# Patient Record
Sex: Female | Born: 1965 | Race: White | Hispanic: No | Marital: Married | Smoking: Former smoker
Health system: Southern US, Community
[De-identification: ages and names within clinical notes are randomized; demographics above are authoritative.]

## PROBLEM LIST (undated history)

## (undated) DIAGNOSIS — T7840XA Allergy, unspecified, initial encounter: Secondary | ICD-10-CM

## (undated) DIAGNOSIS — M1990 Unspecified osteoarthritis, unspecified site: Secondary | ICD-10-CM

## (undated) HISTORY — PX: WISDOM TOOTH EXTRACTION: SHX21

## (undated) HISTORY — DX: Allergy, unspecified, initial encounter: T78.40XA

## (undated) HISTORY — PX: OTHER SURGICAL HISTORY: SHX169

---

## 2011-02-27 ENCOUNTER — Inpatient Hospital Stay (HOSPITAL_COMMUNITY): Admission: RE | Admit: 2011-02-27 | Payer: Worker's Compensation | Source: Ambulatory Visit

## 2012-09-02 ENCOUNTER — Ambulatory Visit (INDEPENDENT_AMBULATORY_CARE_PROVIDER_SITE_OTHER): Payer: 59 | Admitting: Family Medicine

## 2012-09-02 VITALS — BP 112/70 | HR 73 | Temp 98.1°F | Resp 16 | Ht 65.0 in | Wt 122.0 lb

## 2012-09-02 DIAGNOSIS — L237 Allergic contact dermatitis due to plants, except food: Secondary | ICD-10-CM

## 2012-09-02 DIAGNOSIS — L255 Unspecified contact dermatitis due to plants, except food: Secondary | ICD-10-CM

## 2012-09-02 MED ORDER — PREDNISONE 20 MG PO TABS: ORAL_TABLET | ORAL | Status: DC

## 2012-09-02 NOTE — Progress Notes (Signed)
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 Poison ivy  predniSONE (DELTASONE) 20 MG tablet

## 2012-09-02 NOTE — Patient Instructions (Signed)
Poison Ivy  Poison ivy is a inflammation of the skin (contact dermatitis) caused by touching the allergens on the leaves of the ivy plant following previous exposure to the plant. The rash usually appears 48 hours after exposure. The rash is usually bumps (papules) or blisters (vesicles) in a linear pattern. Depending on your own sensitivity, the rash may simply cause redness and itching, or it may also progress to blisters which may break open. These must be well cared for to prevent secondary bacterial (germ) infection, followed by scarring. Keep any open areas dry, clean, dressed, and covered with an antibacterial ointment if needed. The eyes may also get puffy. The puffiness is worst in the morning and gets better as the day progresses. This dermatitis usually heals without scarring, within 2 to 3 weeks without treatment.  HOME CARE INSTRUCTIONS   Thoroughly wash with soap and water as soon as you have been exposed to poison ivy. You have about one half hour to remove the plant resin before it will cause the rash. This washing will destroy the oil or antigen on the skin that is causing, or will cause, the rash. Be sure to wash under your fingernails as any plant resin there will continue to spread the rash. Do not rub skin vigorously when washing affected area. Poison ivy cannot spread if no oil from the plant remains on your body. A rash that has progressed to weeping sores will not spread the rash unless you have not washed thoroughly. It is also important to wash any clothes you have been wearing as these may carry active allergens. The rash will return if you wear the unwashed clothing, even several days later.  Avoidance of the plant in the future is the best measure. Poison ivy plant can be recognized by the number of leaves. Generally, poison ivy has three leaves with flowering branches on a single stem.  Diphenhydramine may be purchased over the counter and used as needed for itching. Do not drive with  this medication if it makes you drowsy.Ask your caregiver about medication for children.  SEEK MEDICAL CARE IF:   Open sores develop.   Redness spreads beyond area of rash.   You notice purulent (pus-like) discharge.   You have increased pain.   Other signs of infection develop (such as fever).  Document Released: 11/02/2000 Document Revised: 01/28/2012 Document Reviewed: 09/21/2009  [employer] Patient Information 2013 [employer].

## 2013-03-03 ENCOUNTER — Ambulatory Visit (INDEPENDENT_AMBULATORY_CARE_PROVIDER_SITE_OTHER): Payer: 59 | Admitting: Physician Assistant

## 2013-03-03 VITALS — BP 100/60 | HR 86 | Temp 98.4°F | Resp 16 | Ht 66.0 in | Wt 124.0 lb

## 2013-03-03 DIAGNOSIS — R05 Cough: Secondary | ICD-10-CM

## 2013-03-03 DIAGNOSIS — J40 Bronchitis, not specified as acute or chronic: Secondary | ICD-10-CM

## 2013-03-03 MED ORDER — AZITHROMYCIN 250 MG PO TABS: ORAL_TABLET | ORAL | Status: DC

## 2013-03-03 MED ORDER — HYDROCODONE-HOMATROPINE 5-1.5 MG/5ML PO SYRP: ORAL_SOLUTION | ORAL | Status: DC

## 2013-03-03 NOTE — Progress Notes (Signed)
Patient ID: Danielle Davidson MRN: [id number], DOB: 1966/04/29, 47 y.o. Date of Encounter: 03/03/2013, 11:52 AM  Primary Physician: No primary provider on file.  Chief Complaint:  Chief Complaint  Patient presents with  . chest congestion  . Cough    HPI: 47 y.o. female presents with a two day history of nasal congestion, post nasal drip, sore throat, and cough. Mild sinus pressure. Afebrile. No chills. Nasal congestion thick and green/yellow. Cough is productive of green/yellow sputum and not associated with time of day. No shortness of breath or wheezing. Ears feel full, leading to sensation of muffled hearing. Has tried OTC cold preps without success. No GI complaints. Appetite normal. Patient states she usually develops nasal congestion first then that progresses to chest congestion and cough, however this time she immediately developed chest congestion and cough prompting her to come in for evaluation. Her partner has been sick with similar symptoms for just over a week. Some tobacco use. No recent antibiotics, or recent travels. No leg trauma, sedentary periods, or h/o cancer.  Past Medical History  Diagnosis Date  . Allergy      Home Meds: Prior to Admission medications   Medication Sig Start Date End Date Taking? Authorizing Provider  fexofenadine (ALLEGRA) 60 MG tablet Take 60 mg by mouth daily.   Yes Historical Provider, MD                  Allergies:  Allergies  Allergen Reactions  . Codeine Nausea Only    History   Social History  . Marital Status: Single    Spouse Name: N/A    Number of Children: N/A  . Years of Education: N/A   Occupational History  . Not on file.   Social History Main Topics  . Smoking status: Current Every Day Smoker  . Smokeless tobacco: Not on file  . Alcohol Use: Yes  . Drug Use: No  . Sexually Active: Not on file   Other Topics Concern  . Not on file   Social History Narrative  . No narrative on file     Review of  Systems: Constitutional: negative for chills, fever, night sweats or weight changes HEENT: see above Cardiovascular: negative for chest pain or palpitations Respiratory: positive for cough. negative for hemoptysis, dyspnea, wheezing, or shortness of breath Abdominal: negative for abdominal pain, nausea, vomiting or diarrhea Dermatological: negative for rash Neurologic: negative for headache   Physical Exam: Blood pressure 100/60, pulse 86, temperature 98.4 F (36.9 C), temperature source Oral, resp. rate 16, height 5\' 6"  (1.676 m), weight 124 lb (56.246 kg), SpO2 100.00%., Body mass index is 20.02 kg/(m^2). General: Well developed, well nourished, in no acute distress. Head: Normocephalic, atraumatic, eyes without discharge, sclera non-icteric, nares are congested. Bilateral auditory canals clear, TM's are without perforation, pearly grey with reflective cone of light bilaterally. No sinus TTP. Oral cavity moist, dentition normal. Posterior pharynx with post nasal drip and mild erythema. No peritonsillar abscess or tonsillar exudate. Neck: Supple. No thyromegaly. Full ROM. No lymphadenopathy. Lungs: Coarse breath sounds bilaterally without wheezes, rales, or rhonchi. Breathing is unlabored.  Heart: RRR with S1 S2. No murmurs, rubs, or gallops appreciated. Msk:  Strength and tone normal for age. Extremities: No clubbing or cyanosis. No edema. Neuro: Alert and oriented X 3. Moves all extremities spontaneously. CNII-XII grossly in tact. Psych:  Responds to questions appropriately with a normal affect.     ASSESSMENT AND PLAN:  47 y.o. female with bronchitis. -Azithromycin 250  MG #6 2 po first day then 1 po next 4 days no RF -Hycodan #4oz 1 tsp po q 4-6 hours prn cough no RF SED -Mucinex -Tylenol/Motrin prn -Rest/fluids -RTC precautions -RTC 3-5 days if no improvement  Signed, Eula Listen, PA-C 03/03/2013 11:52 AM

## 2013-08-31 ENCOUNTER — Ambulatory Visit: Payer: 59

## 2013-08-31 ENCOUNTER — Ambulatory Visit (INDEPENDENT_AMBULATORY_CARE_PROVIDER_SITE_OTHER): Payer: 59 | Admitting: Family Medicine

## 2013-08-31 VITALS — BP 100/60 | HR 88 | Temp 98.7°F | Resp 16 | Ht 65.5 in | Wt 122.2 lb

## 2013-08-31 DIAGNOSIS — F17200 Nicotine dependence, unspecified, uncomplicated: Secondary | ICD-10-CM

## 2013-08-31 DIAGNOSIS — R05 Cough: Secondary | ICD-10-CM

## 2013-08-31 DIAGNOSIS — J40 Bronchitis, not specified as acute or chronic: Secondary | ICD-10-CM

## 2013-08-31 MED ORDER — AZITHROMYCIN 250 MG PO TABS: ORAL_TABLET | ORAL | Status: DC

## 2013-08-31 MED ORDER — HYDROCODONE-HOMATROPINE 5-1.5 MG/5ML PO SYRP: ORAL_SOLUTION | ORAL | Status: DC

## 2013-08-31 NOTE — Patient Instructions (Signed)

## 2013-08-31 NOTE — Progress Notes (Signed)
Subjective:  This chart was scribed for Norberto Sorenson, MD by Quintella Reichert, ED scribe.  This patient was seen in room [facility] Room 5 and the patient's care was started at 9:58 AM.   Patient ID: Danielle Davidson, female    DOB: 06-25-1966, 47 y.o.   MRN: [id number]   Chief Complaint  Patient presents with  . Cold symptoms    cough, yellow mucus,fatigue, sinus pressure    HPI  HPI Comments: Danielle Davidson is a 47 y.o. female who presents with a chief complaint of 3 days of cold symptoms.  Pt states she initially developed a sore throat 3 days ago.  2 days ago she developed sinus congestion and nasal which have been present each day since then but are temporarily relieved by neti pot, saline spray, Allegra, and a hot shower.  Last night she was sitting down watching a movie when she developed chest congestion.  Since then she has been coughing up yellow sputum without blood.  She had a headache yesterday. She denies ear pain, gum pain, facial pain, fevers, chills, or night sweats.  She had trouble sleeping last night due to chest congestion.  She is producing urine regularly and moving her bowels regularly.    Pt states she used to get sinus congestions 3x/year but since she started using a neti pot she has only had 2 over the past 5 years.  She is a half-pack-per-day smoker.   Past Medical History  Diagnosis Date  . Allergy       Medication List       This list is accurate as of: 08/31/13 10:00 AM.  Always use your most recent med list.               azithromycin 250 MG tablet  Commonly known as:  ZITHROMAX Z-PAK  2 tabs po first day, then 1 tab po next 4 days     fexofenadine 60 MG tablet  Commonly known as:  ALLEGRA  Take 60 mg by mouth daily.     HYDROcodone-homatropine 5-1.5 MG/5ML syrup  Commonly known as:  HYCODAN  1 TSP PO Q 4-6 HOURS PRN COUGH         Allergies  Allergen Reactions  . Codeine Nausea Only       Review of Systems  Constitutional: Positive  for fatigue. Negative for fever, chills, diaphoresis, activity change, appetite change and unexpected weight change.  HENT: Positive for congestion, postnasal drip, rhinorrhea, sinus pressure and sore throat. Negative for dental problem, ear pain, facial swelling, mouth sores and nosebleeds.   Respiratory: Positive for cough, shortness of breath and wheezing.   Gastrointestinal: Negative for nausea, vomiting, abdominal pain, diarrhea and constipation.  Genitourinary: Negative for urgency, hematuria and decreased urine volume.  Skin: Negative for rash.  Neurological: Positive for headaches. Negative for dizziness, weakness and light-headedness.  Hematological: Negative for adenopathy. Does not bruise/bleed easily.  Psychiatric/Behavioral: Positive for sleep disturbance.       BP 100/60  Pulse 88  Temp(Src) 98.7 F (37.1 C) (Oral)  Resp 16  Ht 5' 5.5" (1.664 m)  Wt 122 lb 3.2 oz (55.43 kg)  BMI 20.02 kg/m2  SpO2 99% Objective:   Physical Exam  Nursing note and vitals reviewed. Constitutional: She is oriented to person, place, and time. She appears well-developed and well-nourished. No distress.  HENT:  Head: Normocephalic and atraumatic.  Right Ear: Tympanic membrane is not injected. A middle ear effusion is present.  Left Ear: Tympanic membrane is  injected. A middle ear effusion is present.  Mouth/Throat: Posterior oropharyngeal erythema present. No oropharyngeal exudate or posterior oropharyngeal edema.  Left nare swollen with redness  Eyes: EOM are normal.  Neck: Neck supple. No tracheal deviation present.  Cardiovascular: Normal rate, regular rhythm, S1 normal and S2 normal.   Pulmonary/Chest: Effort normal. No respiratory distress. She has no decreased breath sounds. She has wheezes. She has no rhonchi. She has no rales.  Left lower lobe has expiratory wheezing.  Lungs otherwise clear.  Musculoskeletal: Normal range of motion.  Lymphadenopathy:       Head (right side):  Submandibular and tonsillar adenopathy present. No preauricular and no posterior auricular adenopathy present.       Head (left side): No submandibular, no tonsillar, no preauricular and no posterior auricular adenopathy present.    She has cervical adenopathy.       Right cervical: No posterior cervical adenopathy present.      Left cervical: No posterior cervical adenopathy present.  Anterior cervical chain swollen on the left and the right  Neurological: She is alert and oriented to person, place, and time.  Skin: Skin is warm and dry.  Psychiatric: She has a normal mood and affect. Her behavior is normal.   Peak flow 400, goal peak flow 467 Primary chest x-ray read by Dr. Clelia Croft: No acute abnormality.  ?osteoprosis.   XRAY OVERREAD NOTES COPD Assessment & Plan:  Tobacco use disorder - Plan: DG Chest 2 View  Cough - Plan: DG Chest 2 View, HYDROcodone-homatropine (HYCODAN) 5-1.5 MG/5ML syrup, azithromycin (ZITHROMAX Z-PAK) 250 MG tablet  Bronchitis, not specified as acute or chronic - Plan: azithromycin (ZITHROMAX Z-PAK) 250 MG tablet  Meds ordered this encounter  Medications  . HYDROcodone-homatropine (HYCODAN) 5-1.5 MG/5ML syrup    Sig: 1 TSP PO Q6 HOURS PRN COUGH    Dispense:  120 mL    Refill:  0    Order Specific Question:  Supervising Provider    Answer:  Ethelda Chick [[id number]]  . azithromycin (ZITHROMAX Z-PAK) 250 MG tablet    Sig: 2 tabs po first day, then 1 tab po next 4 days    Dispense:  6 tablet    Refill:  0    Order Specific Question:  Supervising Provider    Answer:  Ethelda Chick [[id number]]

## 2013-09-18 ENCOUNTER — Ambulatory Visit (INDEPENDENT_AMBULATORY_CARE_PROVIDER_SITE_OTHER): Payer: 59 | Admitting: Family Medicine

## 2013-09-18 VITALS — BP 90/56 | HR 83 | Temp 98.1°F | Resp 16 | Ht 64.0 in | Wt 125.8 lb

## 2013-09-18 DIAGNOSIS — F17200 Nicotine dependence, unspecified, uncomplicated: Secondary | ICD-10-CM

## 2013-09-18 DIAGNOSIS — J209 Acute bronchitis, unspecified: Secondary | ICD-10-CM

## 2013-09-18 LAB — PULMONARY FUNCTION TEST

## 2013-10-18 NOTE — Progress Notes (Signed)
   Subjective:    Patient ID: Danielle Davidson, female    DOB: [birth year], 47 y.o.   MRN: [id number] Chief Complaint  Patient presents with  . Follow-up    cough   HPI  Ms. Luckow is here for f/u 3 wks after I treated her for a COPD exacerbation. She is a long-time smoker w/ no desire to quit. She does have an occ chronic cough but doesn't bother her, not much sputum, no SHoB or DOE, no wheezing.  She is very physically active - gets lots of exercise, takes the stairs at work. She has no h/o any pulmonary diagnosis but when I saw her for her acute illness sev wks ago I was concerned that her CXR and reduced peak flow were indicative of underlying undiagnosed COPD so she is here to recheck on that. She does not think she has any lung problems or sequelae from smoking.  Past Medical History  Diagnosis Date  . Allergy    Current Outpatient Prescriptions on File Prior to Visit  Medication Sig Dispense Refill  . fexofenadine (ALLEGRA) 60 MG tablet Take 60 mg by mouth daily.       No current facility-administered medications on file prior to visit.   Allergies  Allergen Reactions  . Codeine Nausea Only    Review of Systems  Constitutional: Negative for fever, chills, diaphoresis, activity change and fatigue.  HENT: Negative for congestion, rhinorrhea and sore throat.   Respiratory: Positive for cough. Negative for chest tightness, shortness of breath and wheezing.   Cardiovascular: Negative for chest pain, palpitations and leg swelling.  Musculoskeletal: Negative for arthralgias and back pain.  Hematological: Negative for adenopathy.  Psychiatric/Behavioral: Negative for sleep disturbance.      BP 90/56  Pulse 83  Temp(Src) 98.1 F (36.7 C) (Oral)  Resp 16  Ht 5\' 4"  (1.626 m)  Wt 125 lb 12.8 oz (57.063 kg)  BMI 21.58 kg/m2  SpO2 100%  Objective:   Physical Exam  Constitutional: She is oriented to person, place, and time. She appears well-developed and well-nourished. No  distress.  HENT:  Head: Normocephalic and atraumatic.  Right Ear: Tympanic membrane, external ear and ear canal normal.  Left Ear: Tympanic membrane, external ear and ear canal normal.  Nose: Nose normal. No mucosal edema or rhinorrhea.  Mouth/Throat: Uvula is midline, oropharynx is clear and moist and mucous membranes are normal. No oropharyngeal exudate.  Eyes: Conjunctivae are normal. Right eye exhibits no discharge. Left eye exhibits no discharge. No scleral icterus.  Neck: Normal range of motion. Neck supple.  Cardiovascular: Normal rate, regular rhythm, normal heart sounds and intact distal pulses.   Pulmonary/Chest: Effort normal and breath sounds normal.  Lymphadenopathy:    She has no cervical adenopathy.  Neurological: She is alert and oriented to person, place, and time.  Skin: Skin is warm and dry. She is not diaphoretic. No erythema.  Psychiatric: She has a normal mood and affect. Her behavior is normal.          Assessment & Plan:  Tobacco use disorder - Plan: Pulmonary function test, Pulmonary function test PFTs were entered into flow sheet and scanned into media tab.  Pt's PFTs actually look pretty good and as she is asymptomatic at baseline she does NOT appear to have developed COPD yet. She is pre-contemplative as far as smoking cessation and is glad her lung tests are good.  F/u prn   Norberto Sorenson, MD MPH

## 2014-03-18 ENCOUNTER — Ambulatory Visit (INDEPENDENT_AMBULATORY_CARE_PROVIDER_SITE_OTHER): Payer: 59 | Admitting: Physician Assistant

## 2014-03-18 VITALS — BP 100/60 | HR 80 | Temp 98.2°F | Resp 16 | Ht 65.0 in | Wt 136.0 lb

## 2014-03-18 DIAGNOSIS — J988 Other specified respiratory disorders: Secondary | ICD-10-CM

## 2014-03-18 DIAGNOSIS — R05 Cough: Secondary | ICD-10-CM

## 2014-03-18 DIAGNOSIS — J22 Unspecified acute lower respiratory infection: Secondary | ICD-10-CM

## 2014-03-18 DIAGNOSIS — R059 Cough, unspecified: Secondary | ICD-10-CM

## 2014-03-18 MED ORDER — HYDROCODONE-HOMATROPINE 5-1.5 MG/5ML PO SYRP: 5.0000 mL | ORAL_SOLUTION | Freq: Three times a day (TID) | ORAL | Status: DC | PRN

## 2014-03-18 MED ORDER — AZITHROMYCIN 250 MG PO TABS: ORAL_TABLET | ORAL | Status: DC

## 2014-03-18 NOTE — Progress Notes (Signed)
   Subjective:    Patient ID: Danielle Davidson, female    DOB: 09-Dec-1965, 48 y.o.   MRN: [id number]  HPI 48 year old female presents for evaluation of acute onset of chest congestion, slight cough, chills, body aches and subjective fever. States symptoms started yesterday and have gotten worse today. Had a co-worker who has been sick this week and she wants to "nip this in the bud."  Hx of bronchitis and possibly early COPD. She does continue to smoke, about 1/2 ppd.  Has been treated successfully in the past with a Zpack and Hycodan cough syrup. Reports she tolerates hycodan without any nausea or GI upset.     Review of Systems  Constitutional: Positive for fever (subjective), chills and fatigue.  HENT: Negative for congestion and sore throat.   Respiratory: Positive for cough. Negative for chest tightness and shortness of breath.   Gastrointestinal: Negative for nausea, vomiting and abdominal pain.  Neurological: Negative for dizziness.       Objective:   Physical Exam  Constitutional: She is oriented to person, place, and time. She appears well-developed and well-nourished.  HENT:  Head: Normocephalic and atraumatic.  Right Ear: Hearing, tympanic membrane, external ear and ear canal normal.  Left Ear: Hearing, tympanic membrane, external ear and ear canal normal.  Mouth/Throat: Uvula is midline, oropharynx is clear and moist and mucous membranes are normal.  Eyes: Conjunctivae are normal.  Neck: Normal range of motion.  Cardiovascular: Normal rate, regular rhythm and normal heart sounds.   Pulmonary/Chest: Effort normal and breath sounds normal.  Neurological: She is alert and oriented to person, place, and time.  Psychiatric: She has a normal mood and affect. Her behavior is normal. Judgment and thought content normal.          Assessment & Plan:  Cough - Plan: azithromycin (ZITHROMAX) 250 MG tablet, HYDROcodone-homatropine (HYCODAN) 5-1.5 MG/5ML syrup  Lower respiratory  infection (e.g., bronchitis, pneumonia, pneumonitis, pulmonitis) - Plan: azithromycin (ZITHROMAX) 250 MG tablet, HYDROcodone-homatropine (HYCODAN) 5-1.5 MG/5ML syrup  Likely viral URI. Recommend conservative treatment x 48 hours with ibuprofen as directed and hycodan qhs prn cough.  If no improvement or symptoms worsen, ok to start [place]. RTC precautions discussed Follow up if symptoms worsen or fail to improve.

## 2014-03-22 ENCOUNTER — Ambulatory Visit: Payer: 59

## 2014-03-22 ENCOUNTER — Ambulatory Visit (INDEPENDENT_AMBULATORY_CARE_PROVIDER_SITE_OTHER): Payer: 59 | Admitting: Internal Medicine

## 2014-03-22 VITALS — BP 104/68 | HR 71 | Temp 97.8°F | Resp 16 | Ht 65.0 in | Wt 135.6 lb

## 2014-03-22 DIAGNOSIS — F17200 Nicotine dependence, unspecified, uncomplicated: Secondary | ICD-10-CM

## 2014-03-22 DIAGNOSIS — J329 Chronic sinusitis, unspecified: Secondary | ICD-10-CM

## 2014-03-22 DIAGNOSIS — R51 Headache: Secondary | ICD-10-CM

## 2014-03-22 DIAGNOSIS — R61 Generalized hyperhidrosis: Secondary | ICD-10-CM

## 2014-03-22 LAB — POCT CBC
Granulocyte percent: 58.6 %G (ref 37–80)
HEMATOCRIT: 49.2 % — AB (ref 37.7–47.9)
HEMOGLOBIN: 16.1 g/dL (ref 12.2–16.2)
Lymph, poc: 1.4 (ref 0.6–3.4)
MCH: 30.3 pg (ref 27–31.2)
MCHC: 32.7 g/dL (ref 31.8–35.4)
MCV: 92.6 fL (ref 80–97)
MID (cbc): 0.3 (ref 0–0.9)
MPV: 9 fL (ref 0–99.8)
POC Granulocyte: 2.5 (ref 2–6.9)
POC LYMPH PERCENT: 33.9 %L (ref 10–50)
POC MID %: 7.5 % (ref 0–12)
Platelet Count, POC: 236 10*3/uL (ref 142–424)
RBC: 5.31 M/uL (ref 4.04–5.48)
RDW, POC: 14.4 %
WBC: 4.2 10*3/uL — AB (ref 4.6–10.2)

## 2014-03-22 MED ORDER — LEVOFLOXACIN 500 MG PO TABS: 500.0000 mg | ORAL_TABLET | Freq: Every day | ORAL | Status: DC

## 2014-03-22 MED ORDER — FLUTICASONE PROPIONATE 50 MCG/ACT NA SUSP: 2.0000 | Freq: Every day | NASAL | Status: DC

## 2014-03-22 MED ORDER — PREDNISONE 10 MG PO TABS: ORAL_TABLET | ORAL | Status: DC

## 2014-03-22 NOTE — Progress Notes (Signed)
   Subjective:    Patient ID: Danielle Davidson, female    DOB: 07-Jul-1966, 48 y.o.   MRN: [id number]  HPI    Review of Systems     Objective:   Physical Exam        Assessment & Plan:

## 2014-03-22 NOTE — Progress Notes (Signed)
   Subjective:    Patient ID: Danielle Davidson, female    DOB: 1966/10/05, 48 y.o.   MRN: [id number]  HPI Patient presents today with chest congestion and headache and low grade fever. The fever started yesterday, chest congestion started Friday and she was seen on Friday by Heather. She was prescribed a Z-Pack. The antibiotic has not been helping. She complains of headache and fatigue and sever sinusitis. All of her symptoms have not gotten better. She is feeling worse from Fridays visit. She states she has not had much cough, mainly the sinus pressure is the biggest problem. She has a history of seasonal allergies, although she does not think she is having allergies. She has not seen any ticks on her body. She does not have any chest pain, no SOB, no wheezing. When she blows her nose the mucus is clear. She has tried Afrin otc to help open her sinuses. She states her symptoms started as chest congestion and in the last couple days the symptoms have turned to chills, sweats, sinusitis.   Review of Systems     Objective:   Physical Exam  Vitals reviewed. Constitutional: She is oriented to person, place, and time. She appears well-developed and well-nourished. No distress.  HENT:  Head: Normocephalic.  Nose: Mucosal edema, rhinorrhea and sinus tenderness present. No epistaxis. Right sinus exhibits maxillary sinus tenderness and frontal sinus tenderness. Left sinus exhibits maxillary sinus tenderness and frontal sinus tenderness.  Eyes: Conjunctivae and EOM are normal. Pupils are equal, round, and reactive to light.  Neck: Normal range of motion. Neck supple.  Cardiovascular: Normal rate.   Pulmonary/Chest: Effort normal and breath sounds normal.  Neurological: She is alert and oriented to person, place, and time. No cranial nerve deficit. She exhibits normal muscle tone. Coordination normal.  Psychiatric: She has a normal mood and affect.   Results for orders placed in visit on 03/22/14  POCT  CBC      Result Value Ref Range   WBC 4.2 (*) 4.6 - 10.2 K/uL   Lymph, poc 1.4  0.6 - 3.4   POC LYMPH PERCENT 33.9  10 - 50 %L   MID (cbc) 0.3  0 - 0.9   POC MID % 7.5  0 - 12 %M   POC Granulocyte 2.5  2 - 6.9   Granulocyte percent 58.6  37 - 80 %G   RBC 5.31  4.04 - 5.48 M/uL   Hemoglobin 16.1  12.2 - 16.2 g/dL   HCT, POC 49.2 (*) 37.7 - 47.9 %   MCV 92.6  80 - 97 fL   MCH, POC 30.3  27 - 31.2 pg   MCHC 32.7  31.8 - 35.4 g/dL   RDW, POC 14.4     Platelet Count, POC 236  142 - 424 K/uL   MPV 9.0  0 - 99.8 fL      [facility] reading (PRIMARY) by  Dr Elder Cyphers. Swollen turbinates, no air fluid levels seen      Assessment & Plan:  Sinusitis/Nasal obstruction Prednisone 6d taper/Fluticasone nasal Levaquin

## 2014-03-22 NOTE — Patient Instructions (Addendum)
Sinusitis Sinusitis is redness, soreness, and swelling (inflammation) of the paranasal sinuses. Paranasal sinuses are air pockets within the bones of your face (beneath the eyes, the middle of the forehead, or above the eyes). In healthy paranasal sinuses, mucus is able to drain out, and air is able to circulate through them by way of your nose. However, when your paranasal sinuses are inflamed, mucus and air can become trapped. This can allow bacteria and other germs to grow and cause infection. Sinusitis can develop quickly and last only a short time (acute) or continue over a long period (chronic). Sinusitis that lasts for more than 12 weeks is considered chronic.  CAUSES  Causes of sinusitis include:  Allergies.  Structural abnormalities, such as displacement of the cartilage that separates your nostrils (deviated septum), which can decrease the air flow through your nose and sinuses and affect sinus drainage.  Functional abnormalities, such as when the small hairs (cilia) that line your sinuses and help remove mucus do not work properly or are not present. SYMPTOMS  Symptoms of acute and chronic sinusitis are the same. The primary symptoms are pain and pressure around the affected sinuses. Other symptoms include:  Upper toothache.  Earache.  Headache.  Bad breath.  Decreased sense of smell and taste.  A cough, which worsens when you are lying flat.  Fatigue.  Fever.  Thick drainage from your nose, which often is green and may contain pus (purulent).  Swelling and warmth over the affected sinuses. DIAGNOSIS  Your caregiver will perform a physical exam. During the exam, your caregiver may:  Look in your nose for signs of abnormal growths in your nostrils (nasal polyps).  Tap over the affected sinus to check for signs of infection.  View the inside of your sinuses (endoscopy) with a special imaging device with a light attached (endoscope), which is inserted into your  sinuses. If your caregiver suspects that you have chronic sinusitis, one or more of the following tests may be recommended:  Allergy tests.  Nasal culture A sample of mucus is taken from your nose and sent to a lab and screened for bacteria.  Nasal cytology A sample of mucus is taken from your nose and examined by your caregiver to determine if your sinusitis is related to an allergy. TREATMENT  Most cases of acute sinusitis are related to a viral infection and will resolve on their own within 10 days. Sometimes medicines are prescribed to help relieve symptoms (pain medicine, decongestants, nasal steroid sprays, or saline sprays).  However, for sinusitis related to a bacterial infection, your caregiver will prescribe antibiotic medicines. These are medicines that will help kill the bacteria causing the infection.  Rarely, sinusitis is caused by a fungal infection. In theses cases, your caregiver will prescribe antifungal medicine. For some cases of chronic sinusitis, surgery is needed. Generally, these are cases in which sinusitis recurs more than 3 times per year, despite other treatments. HOME CARE INSTRUCTIONS   Drink plenty of water. Water helps thin the mucus so your sinuses can drain more easily.  Use a humidifier.  Inhale steam 3 to 4 times a day (for example, sit in the bathroom with the shower running).  Apply a warm, moist washcloth to your face 3 to 4 times a day, or as directed by your caregiver.  Use saline nasal sprays to help moisten and clean your sinuses.  Take over-the-counter or prescription medicines for pain, discomfort, or fever only as directed by your caregiver. SEEK IMMEDIATE MEDICAL   CARE IF:  You have increasing pain or severe headaches.  You have nausea, vomiting, or drowsiness.  You have swelling around your face.  You have vision problems.  You have a stiff neck.  You have difficulty breathing. MAKE SURE YOU:   Understand these  instructions.  Will watch your condition.  Will get help right away if you are not doing well or get worse. Document Released: 11/05/2005 Document Revised: 01/28/2012 Document Reviewed: 11/20/2011 [facility] Patient Information 2014 [place], [place]. Chronic Obstructive Pulmonary Disease Chronic obstructive pulmonary disease (COPD) is a common lung condition in which airflow from the lungs is limited. COPD is a general term that can be used to describe many different lung problems that limit airflow, including both chronic bronchitis and emphysema. If you have COPD, your lung function will probably never return to normal, but there are measures you can take to improve lung function and make yourself feel better.  CAUSES   Smoking (common).   Exposure to secondhand smoke.   Genetic problems.  Chronic inflammatory lung diseases or recurrent infections. SYMPTOMS   Shortness of breath, especially with physical activity.   Deep, persistent (chronic) cough with a large amount of thick mucus.   Wheezing.   Rapid breaths (tachypnea).   Gray or bluish discoloration (cyanosis) of the skin, especially in fingers, toes, or lips.   Fatigue.   Weight loss.   Frequent infections or episodes when breathing symptoms become much worse (exacerbations).   Chest tightness. DIAGNOSIS  Your healthcare provider will take a medical history and perform a physical examination to make the initial diagnosis. Additional tests for COPD may include:   Lung (pulmonary) function tests.  Chest X-ray.  CT scan.  Blood tests. TREATMENT  Treatment available to help you feel better when you have COPD include:   Inhaler and nebulizer medicines. These help manage the symptoms of COPD and make your breathing more comfortable  Supplemental oxygen. Supplemental oxygen is only helpful if you have a low oxygen level in your blood.   Exercise and physical activity. These are beneficial for nearly  all people with COPD. Some people may also benefit from a pulmonary rehabilitation program. HOME CARE INSTRUCTIONS   Take all medicines (inhaled or pills) as directed by your health care provider.  Only take over-the-counter or prescription medicines for pain, fever, or discomfort as directed by your health care provider.   Avoid over-the-counter medicines or cough syrups that dry up your airway (such as antihistamines) and slow down the elimination of secretions unless instructed otherwise by your healthcare provider.   If you are a smoker, the most important thing that you can do is stop smoking. Continuing to smoke will cause further lung damage and breathing trouble. Ask your health care provider for help with quitting smoking. He or she can direct you to community resources or hospitals that provide support.  Avoid exposure to irritants such as smoke, chemicals, and fumes that aggravate your breathing.  Use oxygen therapy and pulmonary rehabilitation if directed by your health care provider. If you require home oxygen therapy, ask your healthcare provider whether you should purchase a pulse oximeter to measure your oxygen level at home.   Avoid contact with individuals who have a contagious illness.  Avoid extreme temperature and humidity changes.  Eat healthy foods. Eating smaller, more frequent meals and resting before meals may help you maintain your strength.  Stay active, but balance activity with periods of rest. Exercise and physical activity will help you maintain  your ability to do things you want to do.  Preventing infection and hospitalization is very important when you have COPD. Make sure to receive all the vaccines your health care provider recommends, especially the pneumococcal and influenza vaccines. Ask your healthcare provider whether you need a pneumonia vaccine.  Learn and use relaxation techniques to manage stress.  Learn and use controlled breathing  techniques as directed by your health care provider. Controlled breathing techniques include:   Pursed lip breathing. Start by breathing in (inhaling) through your nose for 1 second. Then, purse your lips as if you were going to whistle and breathe out (exhale) through the pursed lips for 2 seconds.   Diaphragmatic breathing. Start by putting one hand on your abdomen just above your waist. Inhale slowly through your nose. The hand on your abdomen should move out. Then purse your lips and exhale slowly. You should be able to feel the hand on your abdomen moving in as you exhale.   Learn and use controlled coughing to clear mucus from your lungs. Controlled coughing is a series of short, progressive coughs. The steps of controlled coughing are:  1. Lean your head slightly forward.  2. Breathe in deeply using diaphragmatic breathing.  3. Try to hold your breath for 3 seconds.  4. Keep your mouth slightly open while coughing twice.  5. Spit any mucus out into a tissue.  6. Rest and repeat the steps once or twice as needed. SEEK MEDICAL CARE IF:   You are coughing up more mucus than usual.   There is a change in the color or thickness of your mucus.   Your breathing is more labored than usual.   Your breathing is faster than usual.  SEEK IMMEDIATE MEDICAL CARE IF:   You have shortness of breath while you are resting.   You have shortness of breath that prevents you from:  Being able to talk.   Performing your usual physical activities.   You have chest pain lasting longer than 5 minutes.   Your skin color is more cyanotic than usual.  You measure low oxygen saturations for longer than 5 minutes with a pulse oximeter. MAKE SURE YOU:   Understand these instructions.  Will watch your condition.  Will get help right away if you are not doing well or get worse. Document Released: 08/15/2005 Document Revised: 08/26/2013 Document Reviewed: 07/02/2013 [facility]  Patient Information 2014 [place], [place]. Smoking Cessation Quitting smoking is important to your health and has many advantages. However, it is not always easy to quit since nicotine is a very addictive drug. Often times, people try 3 times or more before being able to quit. This document explains the best ways for you to prepare to quit smoking. Quitting takes hard work and a lot of effort, but you can do it. ADVANTAGES OF QUITTING SMOKING  You will live longer, feel better, and live better.  Your body will feel the impact of quitting smoking almost immediately.  Within 20 minutes, blood pressure decreases. Your pulse returns to its normal level.  After 8 hours, carbon monoxide levels in the blood return to normal. Your oxygen level increases.  After 24 hours, the chance of having a heart attack starts to decrease. Your breath, hair, and body stop smelling like smoke.  After 48 hours, damaged nerve endings begin to recover. Your sense of taste and smell improve.  After 72 hours, the body is virtually free of nicotine. Your bronchial tubes relax and breathing becomes easier.  After 2 to 12 weeks, lungs can hold more air. Exercise becomes easier and circulation improves.  The risk of having a heart attack, stroke, cancer, or lung disease is greatly reduced.  After 1 year, the risk of coronary heart disease is cut in half.  After 5 years, the risk of stroke falls to the same as a nonsmoker.  After 10 years, the risk of lung cancer is cut in half and the risk of other cancers decreases significantly.  After 15 years, the risk of coronary heart disease drops, usually to the level of a nonsmoker.  If you are pregnant, quitting smoking will improve your chances of having a healthy baby.  The people you live with, especially any children, will be healthier.  You will have extra money to spend on things other than cigarettes. QUESTIONS TO THINK ABOUT BEFORE ATTEMPTING TO QUIT You may  want to talk about your answers with your caregiver.  Why do you want to quit?  If you tried to quit in the past, what helped and what did not?  What will be the most difficult situations for you after you quit? How will you plan to handle them?  Who can help you through the tough times? Your family? Friends? A caregiver?  What pleasures do you get from smoking? What ways can you still get pleasure if you quit? Here are some questions to ask your caregiver:  How can you help me to be successful at quitting?  What medicine do you think would be best for me and how should I take it?  What should I do if I need more help?  What is smoking withdrawal like? How can I get information on withdrawal? GET READY  Set a quit date.  Change your environment by getting rid of all cigarettes, ashtrays, matches, and lighters in your home, car, or work. Do not let people smoke in your home.  Review your past attempts to quit. Think about what worked and what did not. GET SUPPORT AND ENCOURAGEMENT You have a better chance of being successful if you have help. You can get support in many ways.  Tell your family, friends, and co-workers that you are going to quit and need their support. Ask them not to smoke around you.  Get individual, group, or telephone counseling and support. Programs are available at [employer] and health centers. Call your local health department for information about programs in your area.  Spiritual beliefs and practices may help some smokers quit.  Download a "quit meter" on your computer to keep track of quit statistics, such as how long you have gone without smoking, cigarettes not smoked, and money saved.  Get a self-help book about quitting smoking and staying off of tobacco. [place] yourself from urges to smoke. Talk to someone, go for a walk, or occupy your time with a task.  Change your normal routine. Take a different  route to work. Drink tea instead of coffee. Eat breakfast in a different place.  Reduce your stress. Take a hot bath, exercise, or read a book.  Plan something enjoyable to do every day. Reward yourself for not smoking.  Explore interactive web-based programs that specialize in helping you quit. GET MEDICINE AND USE IT CORRECTLY Medicines can help you stop smoking and decrease the urge to smoke. Combining medicine with the above behavioral methods and support can greatly increase your chances of successfully quitting smoking.  Nicotine replacement therapy helps  deliver nicotine to your body without the negative effects and risks of smoking. Nicotine replacement therapy includes nicotine gum, lozenges, inhalers, nasal sprays, and skin patches. Some may be available over-the-counter and others require a prescription.  Antidepressant medicine helps people abstain from smoking, but how this works is unknown. This medicine is available by prescription.  Nicotinic receptor partial agonist medicine simulates the effect of nicotine in your brain. This medicine is available by prescription. Ask your caregiver for advice about which medicines to use and how to use them based on your health history. Your caregiver will tell you what side effects to look out for if you choose to be on a medicine or therapy. Carefully read the information on the package. Do not use any other product containing nicotine while using a nicotine replacement product.  RELAPSE OR DIFFICULT SITUATIONS Most relapses occur within the first 3 months after quitting. Do not be discouraged if you start smoking again. Remember, most people try several times before finally quitting. You may have symptoms of withdrawal because your body is used to nicotine. You may crave cigarettes, be irritable, feel very hungry, cough often, get headaches, or have difficulty concentrating. The withdrawal symptoms are only temporary. They are strongest when  you first quit, but they will go away within 10 14 days. To reduce the chances of relapse, try to:  Avoid drinking alcohol. Drinking lowers your chances of successfully quitting.  Reduce the amount of caffeine you consume. Once you quit smoking, the amount of caffeine in your body increases and can give you symptoms, such as a rapid heartbeat, sweating, and anxiety.  Avoid smokers because they can make you want to smoke.  Do not let weight gain distract you. Many smokers will gain weight when they quit, usually less than 10 pounds. Eat a healthy diet and stay active. You can always lose the weight gained after you quit.  Find ways to improve your mood other than smoking. FOR MORE INFORMATION  www.smokefree.gov  Document Released: 10/30/2001 Document Revised: 05/06/2012 Document Reviewed: 02/14/2012 [facility] Patient Information 2014 [place], [place].

## 2014-09-16 ENCOUNTER — Ambulatory Visit (INDEPENDENT_AMBULATORY_CARE_PROVIDER_SITE_OTHER): Payer: 59 | Admitting: Family Medicine

## 2014-09-16 VITALS — BP 104/68 | HR 69 | Temp 98.1°F | Resp 20 | Ht 65.25 in | Wt 138.4 lb

## 2014-09-16 DIAGNOSIS — L200 Besnier's prurigo: Secondary | ICD-10-CM

## 2014-09-16 DIAGNOSIS — L239 Allergic contact dermatitis, unspecified cause: Secondary | ICD-10-CM

## 2014-09-16 MED ORDER — TRIAMCINOLONE ACETONIDE 0.1 % EX CREA: 1.0000 "application " | TOPICAL_CREAM | Freq: Two times a day (BID) | CUTANEOUS | Status: DC

## 2014-09-16 NOTE — Progress Notes (Signed)
Subjective: For 2 or 3 weeks patient has developed a area of rash on her left lateral upper abdominal wall. She said a smaller spot that has spread. No known contact etiology. It itches and is not painful. None elsewhere.  Objective: Area of erythema, fairly dry and mildly flaking, about 3 x 4-5 cm in diameter. No areas of rash noted. 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  Assessment: Allergic dermatitis, probably contact of some sort.  Plan: Treat symptomatically with triamcinolone cream. If she is not doing better with need of a punch biopsy. No systemic medications given at this time.

## 2014-09-16 NOTE — Patient Instructions (Addendum)
Apply a small amount of triamcinolone cream rubbed into the affected area 2 or 3 times daily  If rash is spreading elsewhere please return  If not much better in the next 7-10 days please return

## 2014-10-22 ENCOUNTER — Other Ambulatory Visit: Payer: Self-pay | Admitting: Obstetrics and Gynecology

## 2014-10-22 DIAGNOSIS — R928 Other abnormal and inconclusive findings on diagnostic imaging of breast: Secondary | ICD-10-CM

## 2014-11-01 ENCOUNTER — Ambulatory Visit
Admission: RE | Admit: 2014-11-01 | Discharge: 2014-11-01 | Disposition: A | Discharge status: Home / Self Care | Payer: 59 | Source: Ambulatory Visit | Attending: Obstetrics and Gynecology | Admitting: Obstetrics and Gynecology

## 2014-11-01 DIAGNOSIS — R928 Other abnormal and inconclusive findings on diagnostic imaging of breast: Secondary | ICD-10-CM

## 2015-07-14 ENCOUNTER — Other Ambulatory Visit: Payer: Self-pay | Admitting: Obstetrics and Gynecology

## 2015-08-14 NOTE — H&P (Signed)
49 y.o. yo complains of unremitting RLQ pain possibly associated with recurrent cysts or undiagnosed endometriosis.  Previously:pt had U/S for lower abdominal pain, states that she has had some break through bleeding this summer (unsure which month) last LMP >1.5 years ago. no other complaints. [place] last visit time RO 2 cm solid (probably resolving hemorrhagic cyst); 2.6 cm complex; LO 3.7 cm simple--none with increased blood flow.  CA 125 was 6. On most recent US, RO 2.6 and 1.5 simple cysts; complex area 1 cm with no solidity; LO simple cyst 1.6 cm. EM 1.3 mm.  Pain cycle in 2009- was having pain already for several years then, every two to three months would have cycle that would be bad and cause nausea. Progesterone therapy didn't seem to help. Pt was about to get surgery but then moved. Since started Jintili has been less but still having episodes. Pt may have endometriosis that has not been previously diagnosed. Pt is at end of rope and desires to have TLH/BSO.  Does have leaking urine with cough, sneeze, jump. No urge. Pt does a lot of heavy lifting with weekend projects. Uncertain if would be able to give up lifting. Leaking happens a lot but is not keeping from activities and is not truly annoying.    Past Medical History  Diagnosis Date  . Allergy   No past surgical history on file.  Social History   Social History  . Marital Status: Married    Spouse Name: N/A  . Number of Children: N/A  . Years of Education: N/A   Occupational History  . Not on file.   Social History Main Topics  . Smoking status: Current Every Day Smoker  . Smokeless tobacco: Not on file  . Alcohol Use: Yes  . Drug Use: No  . Sexual Activity: Not on file   Other Topics Concern  . Not on file   Social History Narrative    No current facility-administered medications on file prior to encounter.   Current Outpatient Prescriptions on File Prior to Encounter  Medication Sig Dispense Refill  .  fexofenadine (ALLEGRA) 60 MG tablet Take 60 mg by mouth daily.      Allergies  Allergen Reactions  . Codeine Nausea Only    @VITALS2 @  Lungs: clear to ascultation Cor:  RRR Abdomen:  soft, nontender, nondistended. Ex:  no cords, erythema Pelvic:  Vulva: no masses, atrophy, or lesions. Vagina: no tenderness, erythema, cystocele, rectocele, abnormal vaginal discharge, or vesicle(s) or ulcers. Cervix: no discharge or cervical motion tenderness and grossly normal. Uterus: normal shape and size (7) and midline, non-tender, and no uterine prolapse. Bladder/Urethra: no urethral discharge or mass and normal meatus, bladder non distended, and Urethra well supported. Adnexa/Parametria: no parametrial tenderness or mass and no adnexal tenderness or ovarian mass.     A:  Unremitting RLQ pain possibly related to ovarian cysts or undiagnosed endometriosis.  After multiple treatments and ultrasounds, pt strongly desires definitive, including BSO.  For robotic TLH/BSO/cautery of endometriosis.   P: All risks, benefits and alternatives d/w patient and she desires to proceed.  Patient has undergone a modified bowel prep and will receive preop antibiotics and SCDs during the operation.     HORVATH,MICHELLE A

## 2015-08-15 ENCOUNTER — Other Ambulatory Visit: Payer: Self-pay

## 2015-08-15 ENCOUNTER — Encounter (HOSPITAL_COMMUNITY): Payer: Self-pay

## 2015-08-15 ENCOUNTER — Encounter (HOSPITAL_COMMUNITY)
Admission: RE | Admit: 2015-08-15 | Discharge: 2015-08-15 | Disposition: A | Discharge status: Home / Self Care | Payer: 59 | Source: Ambulatory Visit | Attending: Obstetrics and Gynecology | Admitting: Obstetrics and Gynecology

## 2015-08-15 DIAGNOSIS — N879 Dysplasia of cervix uteri, unspecified: Secondary | ICD-10-CM | POA: Diagnosis not present

## 2015-08-15 DIAGNOSIS — Z885 Allergy status to narcotic agent status: Secondary | ICD-10-CM | POA: Diagnosis not present

## 2015-08-15 DIAGNOSIS — D3A8 Other benign neuroendocrine tumors: Secondary | ICD-10-CM | POA: Diagnosis not present

## 2015-08-15 DIAGNOSIS — N8320 Unspecified ovarian cysts: Secondary | ICD-10-CM | POA: Diagnosis not present

## 2015-08-15 DIAGNOSIS — F17210 Nicotine dependence, cigarettes, uncomplicated: Secondary | ICD-10-CM | POA: Diagnosis not present

## 2015-08-15 DIAGNOSIS — R102 Pelvic and perineal pain: Secondary | ICD-10-CM | POA: Diagnosis present

## 2015-08-15 DIAGNOSIS — N938 Other specified abnormal uterine and vaginal bleeding: Secondary | ICD-10-CM | POA: Diagnosis not present

## 2015-08-15 DIAGNOSIS — N830 Follicular cyst of ovary: Secondary | ICD-10-CM | POA: Diagnosis not present

## 2015-08-15 DIAGNOSIS — M1990 Unspecified osteoarthritis, unspecified site: Secondary | ICD-10-CM | POA: Diagnosis not present

## 2015-08-15 HISTORY — DX: Unspecified osteoarthritis, unspecified site: M19.90

## 2015-08-15 LAB — CBC
HCT: 44.4 % (ref 36.0–46.0)
Hemoglobin: 15.1 g/dL — ABNORMAL HIGH (ref 12.0–15.0)
MCH: 30.6 pg (ref 26.0–34.0)
MCHC: 34 g/dL (ref 30.0–36.0)
MCV: 90.1 fL (ref 78.0–100.0)
PLATELETS: 196 10*3/uL (ref 150–400)
RBC: 4.93 MIL/uL (ref 3.87–5.11)
RDW: 13.8 % (ref 11.5–15.5)
WBC: 5.3 10*3/uL (ref 4.0–10.5)

## 2015-08-15 NOTE — Patient Instructions (Signed)
Your procedure is scheduled on: August 17, 2015  Enter through the Main Entrance of [facility] at:  10:45    Pick up the phone at the desk and dial [phone].  Call this number if you have problems the morning of surgery: [phone].  Remember: Do NOT eat food: after midnight on Tuesday   Do NOT drink clear liquids after: after midnight on Tuesday  Take these medicines the morning of surgery with a SIP OF WATER:  None   Do NOT wear jewelry (body piercing), metal hair clips/bobby pins, make-up, or nail polish. Do NOT wear lotions, powders, or perfumes.  You may wear deoderant. Do NOT shave for 48 hours prior to surgery. Do NOT bring valuables to the hospital. Contacts, dentures, or bridgework may not be worn into surgery. Leave suitcase in car.  After surgery it may be brought to your room.  For patients admitted to the hospital, checkout time is 11:00 AM the day of discharge.

## 2015-08-15 NOTE — Pre-Procedure Instructions (Signed)
Per the patient Dr. [name] instructed her to be here at 8:15am day of surgery in case she finished her previous case sooner.

## 2015-08-17 ENCOUNTER — Ambulatory Visit (HOSPITAL_COMMUNITY)
Admission: RE | Admit: 2015-08-17 | Discharge: 2015-08-18 | Disposition: A | Discharge status: Home / Self Care | Payer: 59 | Source: Ambulatory Visit | Attending: Obstetrics and Gynecology | Admitting: Obstetrics and Gynecology

## 2015-08-17 ENCOUNTER — Ambulatory Visit (HOSPITAL_COMMUNITY): Payer: 59 | Admitting: Anesthesiology

## 2015-08-17 ENCOUNTER — Encounter (HOSPITAL_COMMUNITY)
Admission: RE | Disposition: A | Discharge status: Home / Self Care | Payer: Self-pay | Source: Ambulatory Visit | Attending: Obstetrics and Gynecology

## 2015-08-17 ENCOUNTER — Encounter (HOSPITAL_COMMUNITY): Payer: Self-pay | Admitting: *Deleted

## 2015-08-17 DIAGNOSIS — N8320 Unspecified ovarian cysts: Secondary | ICD-10-CM | POA: Insufficient documentation

## 2015-08-17 DIAGNOSIS — Z9889 Other specified postprocedural states: Secondary | ICD-10-CM

## 2015-08-17 DIAGNOSIS — N830 Follicular cyst of ovary: Secondary | ICD-10-CM | POA: Insufficient documentation

## 2015-08-17 DIAGNOSIS — F17210 Nicotine dependence, cigarettes, uncomplicated: Secondary | ICD-10-CM | POA: Insufficient documentation

## 2015-08-17 DIAGNOSIS — M1990 Unspecified osteoarthritis, unspecified site: Secondary | ICD-10-CM | POA: Insufficient documentation

## 2015-08-17 DIAGNOSIS — Z885 Allergy status to narcotic agent status: Secondary | ICD-10-CM | POA: Insufficient documentation

## 2015-08-17 DIAGNOSIS — D3A8 Other benign neuroendocrine tumors: Secondary | ICD-10-CM | POA: Insufficient documentation

## 2015-08-17 DIAGNOSIS — N879 Dysplasia of cervix uteri, unspecified: Secondary | ICD-10-CM | POA: Insufficient documentation

## 2015-08-17 DIAGNOSIS — N938 Other specified abnormal uterine and vaginal bleeding: Secondary | ICD-10-CM | POA: Insufficient documentation

## 2015-08-17 HISTORY — PX: ROBOTIC ASSISTED TOTAL HYSTERECTOMY WITH BILATERAL SALPINGO OOPHERECTOMY: SHX6086

## 2015-08-17 HISTORY — PX: CYSTOSCOPY: SHX5120

## 2015-08-17 HISTORY — PX: APPENDECTOMY: SHX54

## 2015-08-17 SURGERY — ROBOTIC ASSISTED TOTAL HYSTERECTOMY WITH BILATERAL SALPINGO OOPHORECTOMY
Anesthesia: General | Site: Abdomen

## 2015-08-17 MED ORDER — HYDROMORPHONE HCL 1 MG/ML IJ SOLN
INTRAMUSCULAR | Status: AC
  Filled 2015-08-17: qty 1

## 2015-08-17 MED ORDER — NICOTINE 21 MG/24HR TD PT24
21.0000 mg | MEDICATED_PATCH | Freq: Every day | TRANSDERMAL | Status: DC
  Administered 2015-08-17 – 2015-08-18 (×2): 21 mg via TRANSDERMAL
  Filled 2015-08-17 (×2): qty 1

## 2015-08-17 MED ORDER — KETOROLAC TROMETHAMINE 30 MG/ML IJ SOLN
INTRAMUSCULAR | Status: AC
  Filled 2015-08-17: qty 1

## 2015-08-17 MED ORDER — MENTHOL 3 MG MT LOZG: 1.0000 | LOZENGE | OROMUCOSAL | Status: DC | PRN

## 2015-08-17 MED ORDER — FENTANYL CITRATE (PF) 100 MCG/2ML IJ SOLN
INTRAMUSCULAR | Status: DC | PRN
  Administered 2015-08-17 (×6): 50 ug via INTRAVENOUS
  Administered 2015-08-17: 100 ug via INTRAVENOUS

## 2015-08-17 MED ORDER — LACTATED RINGERS IV BOLUS (SEPSIS)
500.0000 mL | Freq: Once | INTRAVENOUS | Status: AC
  Administered 2015-08-17: 500 mL via INTRAVENOUS

## 2015-08-17 MED ORDER — ROCURONIUM BROMIDE 100 MG/10ML IV SOLN
INTRAVENOUS | Status: AC
Start: 2015-08-17 — End: 2015-08-17
  Filled 2015-08-17: qty 1

## 2015-08-17 MED ORDER — IBUPROFEN 800 MG PO TABS: 800.0000 mg | ORAL_TABLET | Freq: Three times a day (TID) | ORAL | Status: DC | PRN

## 2015-08-17 MED ORDER — SODIUM CHLORIDE 0.9 % IJ SOLN
INTRAMUSCULAR | Status: AC
  Filled 2015-08-17: qty 50

## 2015-08-17 MED ORDER — FENTANYL CITRATE (PF) 250 MCG/5ML IJ SOLN
INTRAMUSCULAR | Status: AC
  Filled 2015-08-17: qty 25

## 2015-08-17 MED ORDER — LACTATED RINGERS IV SOLN
INTRAVENOUS | Status: DC
  Administered 2015-08-17 (×2): via INTRAVENOUS

## 2015-08-17 MED ORDER — ONDANSETRON HCL 4 MG/2ML IJ SOLN: 4.0000 mg | Freq: Four times a day (QID) | INTRAMUSCULAR | Status: DC | PRN

## 2015-08-17 MED ORDER — ROPIVACAINE HCL 5 MG/ML IJ SOLN
INTRAMUSCULAR | Status: AC
  Filled 2015-08-17: qty 30

## 2015-08-17 MED ORDER — KETOROLAC TROMETHAMINE 30 MG/ML IJ SOLN
INTRAMUSCULAR | Status: DC | PRN
  Administered 2015-08-17: 30 mg via INTRAVENOUS

## 2015-08-17 MED ORDER — MIDAZOLAM HCL 2 MG/2ML IJ SOLN
INTRAMUSCULAR | Status: AC
  Filled 2015-08-17: qty 4

## 2015-08-17 MED ORDER — CEFAZOLIN SODIUM-DEXTROSE 2-3 GM-% IV SOLR
INTRAVENOUS | Status: AC
  Filled 2015-08-17: qty 50

## 2015-08-17 MED ORDER — OXYCODONE-ACETAMINOPHEN 5-325 MG PO TABS
1.0000 | ORAL_TABLET | ORAL | Status: DC | PRN
  Administered 2015-08-17: 1 via ORAL
  Administered 2015-08-18: 2 via ORAL
  Filled 2015-08-17: qty 1
  Filled 2015-08-17: qty 2

## 2015-08-17 MED ORDER — KETOROLAC TROMETHAMINE 30 MG/ML IJ SOLN
30.0000 mg | Freq: Four times a day (QID) | INTRAMUSCULAR | Status: DC
  Administered 2015-08-17 – 2015-08-18 (×2): 30 mg via INTRAVENOUS
  Filled 2015-08-17 (×2): qty 1

## 2015-08-17 MED ORDER — ONDANSETRON HCL 4 MG/2ML IJ SOLN
INTRAMUSCULAR | Status: DC | PRN
  Administered 2015-08-17: 4 mg via INTRAVENOUS

## 2015-08-17 MED ORDER — LORATADINE 10 MG PO TABS
10.0000 mg | ORAL_TABLET | Freq: Every day | ORAL | Status: DC
  Filled 2015-08-17 (×2): qty 1

## 2015-08-17 MED ORDER — CEFAZOLIN SODIUM-DEXTROSE 2-3 GM-% IV SOLR
2.0000 g | INTRAVENOUS | Status: AC
  Administered 2015-08-17: 2 g via INTRAVENOUS

## 2015-08-17 MED ORDER — MEPERIDINE HCL 25 MG/ML IJ SOLN: 6.2500 mg | INTRAMUSCULAR | Status: DC | PRN

## 2015-08-17 MED ORDER — PROPOFOL 10 MG/ML IV BOLUS
INTRAVENOUS | Status: AC
  Filled 2015-08-17: qty 20

## 2015-08-17 MED ORDER — ONDANSETRON HCL 4 MG PO TABS: 4.0000 mg | ORAL_TABLET | Freq: Four times a day (QID) | ORAL | Status: DC | PRN

## 2015-08-17 MED ORDER — LACTATED RINGERS IR SOLN
Status: DC | PRN
  Administered 2015-08-17: 3000 mL

## 2015-08-17 MED ORDER — SCOPOLAMINE 1 MG/3DAYS TD PT72
MEDICATED_PATCH | TRANSDERMAL | Status: AC
  Administered 2015-08-17: 1.5 mg via TRANSDERMAL
  Filled 2015-08-17: qty 1

## 2015-08-17 MED ORDER — PROPOFOL 10 MG/ML IV BOLUS
INTRAVENOUS | Status: DC | PRN
  Administered 2015-08-17: 150 mg via INTRAVENOUS

## 2015-08-17 MED ORDER — FENTANYL CITRATE (PF) 250 MCG/5ML IJ SOLN
INTRAMUSCULAR | Status: AC
Start: 2015-08-17 — End: 2015-08-17
  Filled 2015-08-17: qty 25

## 2015-08-17 MED ORDER — STERILE WATER FOR IRRIGATION IR SOLN
Status: DC | PRN
  Administered 2015-08-17: 3000 mL

## 2015-08-17 MED ORDER — DEXAMETHASONE SODIUM PHOSPHATE 10 MG/ML IJ SOLN
INTRAMUSCULAR | Status: DC | PRN
  Administered 2015-08-17: 4 mg via INTRAVENOUS

## 2015-08-17 MED ORDER — HYDROMORPHONE HCL 1 MG/ML IJ SOLN
INTRAMUSCULAR | Status: DC | PRN
  Administered 2015-08-17: 1 mg via INTRAVENOUS

## 2015-08-17 MED ORDER — ONDANSETRON HCL 4 MG/2ML IJ SOLN
INTRAMUSCULAR | Status: AC
  Filled 2015-08-17: qty 2

## 2015-08-17 MED ORDER — PROMETHAZINE HCL 25 MG/ML IJ SOLN: 6.2500 mg | INTRAMUSCULAR | Status: DC | PRN

## 2015-08-17 MED ORDER — NEOSTIGMINE METHYLSULFATE 10 MG/10ML IV SOLN
INTRAVENOUS | Status: DC | PRN
  Administered 2015-08-17: 3 mg via INTRAVENOUS

## 2015-08-17 MED ORDER — GLYCOPYRROLATE 0.2 MG/ML IJ SOLN
INTRAMUSCULAR | Status: DC | PRN
  Administered 2015-08-17: .6 mg via INTRAVENOUS

## 2015-08-17 MED ORDER — GLYCOPYRROLATE 0.2 MG/ML IJ SOLN
INTRAMUSCULAR | Status: AC
  Filled 2015-08-17: qty 3

## 2015-08-17 MED ORDER — LIDOCAINE HCL (CARDIAC) 20 MG/ML IV SOLN
INTRAVENOUS | Status: DC | PRN
  Administered 2015-08-17: 100 mg via INTRAVENOUS

## 2015-08-17 MED ORDER — ROCURONIUM BROMIDE 100 MG/10ML IV SOLN
INTRAVENOUS | Status: DC | PRN
  Administered 2015-08-17 (×2): 10 mg via INTRAVENOUS
  Administered 2015-08-17: 50 mg via INTRAVENOUS
  Administered 2015-08-17: 10 mg via INTRAVENOUS

## 2015-08-17 MED ORDER — MIDAZOLAM HCL 2 MG/2ML IJ SOLN
INTRAMUSCULAR | Status: DC | PRN
  Administered 2015-08-17: 2 mg via INTRAVENOUS

## 2015-08-17 MED ORDER — ROPIVACAINE HCL 5 MG/ML IJ SOLN
INTRAMUSCULAR | Status: DC | PRN
  Administered 2015-08-17: 60 mL

## 2015-08-17 MED ORDER — DEXAMETHASONE SODIUM PHOSPHATE 4 MG/ML IJ SOLN
INTRAMUSCULAR | Status: AC
  Filled 2015-08-17: qty 1

## 2015-08-17 MED ORDER — HYDROMORPHONE HCL 1 MG/ML IJ SOLN: 0.2500 mg | INTRAMUSCULAR | Status: DC | PRN

## 2015-08-17 MED ORDER — NEOSTIGMINE METHYLSULFATE 10 MG/10ML IV SOLN
INTRAVENOUS | Status: AC
  Filled 2015-08-17: qty 1

## 2015-08-17 MED ORDER — LIDOCAINE HCL (CARDIAC) 20 MG/ML IV SOLN
INTRAVENOUS | Status: AC
  Filled 2015-08-17: qty 5

## 2015-08-17 MED ORDER — SCOPOLAMINE 1 MG/3DAYS TD PT72
1.0000 | MEDICATED_PATCH | Freq: Once | TRANSDERMAL | Status: DC
  Administered 2015-08-17: 1.5 mg via TRANSDERMAL

## 2015-08-17 MED ORDER — LACTATED RINGERS IV SOLN
INTRAVENOUS | Status: DC
  Administered 2015-08-17: 16:00:00 via INTRAVENOUS

## 2015-08-17 SURGICAL SUPPLY — 74 items
BARRIER ADHS 3X4 INTERCEED (GAUZE/BANDAGES/DRESSINGS) ×8 IMPLANT
BENZOIN TINCTURE PRP APPL 2/3 (GAUZE/BANDAGES/DRESSINGS) ×4 IMPLANT
CANISTER SUCT 3000ML (MISCELLANEOUS) ×4 IMPLANT
CATH ROBINSON RED A/P 16FR (CATHETERS) ×4 IMPLANT
CLOTH BEACON ORANGE TIMEOUT ST (SAFETY) ×4 IMPLANT
CONT PATH 16OZ SNAP LID 3702 (MISCELLANEOUS) ×4 IMPLANT
COVER BACK TABLE 60X90IN (DRAPES) ×8 IMPLANT
COVER TIP SHEARS 8 DVNC (MISCELLANEOUS) ×3 IMPLANT
COVER TIP SHEARS 8MM DA VINCI (MISCELLANEOUS) ×1
DECANTER SPIKE VIAL GLASS SM (MISCELLANEOUS) ×4 IMPLANT
DRAPE WARM FLUID 44X44 (DRAPE) ×4 IMPLANT
DRSG COVADERM PLUS 2X2 (GAUZE/BANDAGES/DRESSINGS) ×16 IMPLANT
DRSG OPSITE POSTOP 3X4 (GAUZE/BANDAGES/DRESSINGS) ×4 IMPLANT
DURAPREP 26ML APPLICATOR (WOUND CARE) ×4 IMPLANT
ELECT REM PT RETURN 9FT ADLT (ELECTROSURGICAL) ×4
ELECTRODE REM PT RTRN 9FT ADLT (ELECTROSURGICAL) ×3 IMPLANT
GAUZE VASELINE 3X9 (GAUZE/BANDAGES/DRESSINGS) IMPLANT
GLOVE BIO SURGEON STRL SZ 6.5 (GLOVE) ×4 IMPLANT
GLOVE BIO SURGEON STRL SZ7 (GLOVE) ×8 IMPLANT
GLOVE BIOGEL PI IND STRL 6.5 (GLOVE) ×3 IMPLANT
GLOVE BIOGEL PI IND STRL 7.0 (GLOVE) ×6 IMPLANT
GLOVE BIOGEL PI INDICATOR 6.5 (GLOVE) ×1
GLOVE BIOGEL PI INDICATOR 7.0 (GLOVE) ×2
GLOVE ECLIPSE 6.5 STRL STRAW (GLOVE) ×12 IMPLANT
GOWN STRL REUS W/TWL LRG LVL3 (GOWN DISPOSABLE) ×8 IMPLANT
GYRUS RUMI II 2.5CM BLUE (DISPOSABLE) ×4
GYRUS RUMI II 3.5CM BLUE (DISPOSABLE)
GYRUS RUMI II 4.0CM BLUE (DISPOSABLE)
KIT ACCESSORY DA VINCI DISP (KITS) ×1
KIT ACCESSORY DVNC DISP (KITS) ×3 IMPLANT
LEGGING LITHOTOMY PAIR STRL (DRAPES) ×4 IMPLANT
LIQUID BAND (GAUZE/BANDAGES/DRESSINGS) ×4 IMPLANT
MANIPULATOR UTERINE 4.5 ZUMI (MISCELLANEOUS) IMPLANT
NEEDLE INSUFFLATION 120MM (ENDOMECHANICALS) ×4 IMPLANT
OCCLUDER COLPOPNEUMO (BALLOONS) ×4 IMPLANT
PACK ROBOT WH (CUSTOM PROCEDURE TRAY) ×4 IMPLANT
PACK ROBOTIC GOWN (GOWN DISPOSABLE) ×4 IMPLANT
PAD POSITIONING PINK XL (MISCELLANEOUS) ×4 IMPLANT
PAD PREP 24X48 CUFFED NSTRL (MISCELLANEOUS) ×8 IMPLANT
POUCH SPECIMEN RETRIEVAL 10MM (ENDOMECHANICALS) ×4 IMPLANT
RELOAD STAPLE TA45 3.5 REG BLU (ENDOMECHANICALS) ×4 IMPLANT
RUMI II 3.0CM BLUE KOH-EFFICIE (DISPOSABLE) IMPLANT
RUMI II GYRUS 2.5CM BLUE (DISPOSABLE) ×3 IMPLANT
RUMI II GYRUS 3.5CM BLUE (DISPOSABLE) IMPLANT
RUMI II GYRUS 4.0CM BLUE (DISPOSABLE) IMPLANT
SET CYSTO W/LG BORE CLAMP LF (SET/KITS/TRAYS/PACK) ×4 IMPLANT
SET IRRIG TUBING LAPAROSCOPIC (IRRIGATION / IRRIGATOR) ×4 IMPLANT
SET TRI-LUMEN FLTR TB AIRSEAL (TUBING) ×4 IMPLANT
STRIP CLOSURE SKIN 1/2X4 (GAUZE/BANDAGES/DRESSINGS) ×4 IMPLANT
SUT DVC VLOC 180 0 12IN GS21 (SUTURE)
SUT VIC AB 0 CT1 27 (SUTURE)
SUT VIC AB 0 CT1 27XBRD ANBCTR (SUTURE) IMPLANT
SUT VIC AB 2-0 CT2 27 (SUTURE) ×8 IMPLANT
SUT VIC AB 2-0 UR6 27 (SUTURE) ×4 IMPLANT
SUT VICRYL RAPIDE 3 0 (SUTURE) ×8 IMPLANT
SUT VLOC 180 0 9IN  GS21 (SUTURE)
SUT VLOC 180 0 9IN GS21 (SUTURE) IMPLANT
SUTURE DVC VLC 180 0 12IN GS21 (SUTURE) IMPLANT
SYR 50ML LL SCALE MARK (SYRINGE) ×4 IMPLANT
SYSTEM CONVERTIBLE TROCAR (TROCAR) ×4 IMPLANT
TIP RUMI ORANGE 6.7MMX12CM (TIP) IMPLANT
TIP UTERINE 5.1X6CM LAV DISP (MISCELLANEOUS) IMPLANT
TIP UTERINE 6.7X10CM GRN DISP (MISCELLANEOUS) IMPLANT
TIP UTERINE 6.7X6CM WHT DISP (MISCELLANEOUS) IMPLANT
TIP UTERINE 6.7X8CM BLUE DISP (MISCELLANEOUS) ×4 IMPLANT
TOWEL OR 17X24 6PK STRL BLUE (TOWEL DISPOSABLE) ×8 IMPLANT
TRAY FOLEY CATH SILVER 14FR (SET/KITS/TRAYS/PACK) ×8 IMPLANT
TROCAR DILATING TIP 12MM 150MM (ENDOMECHANICALS) ×4 IMPLANT
TROCAR DISP BLADELESS 8 DVNC (TROCAR) ×3 IMPLANT
TROCAR DISP BLADELESS 8MM (TROCAR) ×1
TROCAR PORT AIRSEAL 5X120 (TROCAR) ×4 IMPLANT
TROCAR XCEL 12X100 BLDLESS (ENDOMECHANICALS) IMPLANT
TROCAR XCEL NON-BLD 5MMX100MML (ENDOMECHANICALS) ×4 IMPLANT
WATER STERILE IRR 1000ML POUR (IV SOLUTION) ×12 IMPLANT

## 2015-08-17 NOTE — Brief Op Note (Signed)
08/17/2015  1:54 PM  PATIENT:  Danielle Davidson  49 y.o. female  PRE-OPERATIVE DIAGNOSIS:  OVARIAN CYST, PELVIC PAIN, DUB  POST-OPERATIVE DIAGNOSIS:  ovarian cyst, pelvic pain, DUB  PROCEDURE:  Procedure(s): ROBOTIC ASSISTED TOTAL HYSTERECTOMY WITH BILATERAL SALPINGO OOPHORECTOMY (Bilateral) CYSTOSCOPY (N/A) ROBOTIC ASSISTED  APPENDECTOMY  SURGEON:  Surgeon(s) and Role:    * Bobbye Charleston, MD - Primary    * Allyn Kenner, DO - Assisting    * Michael Boston, MD - Assisting   ANESTHESIA:   general  EBL:  Total I/O In: 1000 [I.V.:1000] Out: 300 [Urine:100; Blood:200]   LOCAL MEDICATIONS USED:  OTHER Ropivicaine into pelvis; interceed  SPECIMEN:  Source of Specimen:  uterus, cervix, ovaries, tubes and appendix  DISPOSITION OF SPECIMEN:  PATHOLOGY  COUNTS:  YES  TOURNIQUET:  * No tourniquets in log *  DICTATION: .Note written in EPIC  PLAN OF CARE: Admit for overnight observation  PATIENT DISPOSITION:  PACU - hemodynamically stable.   Delay start of Pharmacological VTE agent (>24hrs) due to surgical blood loss or risk of bleeding: not applicable  Complications:  None.  Findings: 8 weeks size uterus.  Left ovary was normal.  Right ovary was slightly enlarged with multiple clear cysts.  The L tube was dilated and wrapped around the ovary.  The bowel at the junction of the cecum was adhesed to other bowel and the right ovary.  The omentum was adhesed with the descending colon to the anterior abdominal wall. The appendix appeared abnormal and adhesed to the colon.  Dr. Johney Maine was asked to evaluated and he determined that the appendix needed to be removed. The ureters were dissected out and identified during multiple points of the case and were always out of the field of dissection.  On cystoscopy, the bladder was intact and bilateral spill was seen from each ureteral oriface.    Medications:  Ancef. Pyridium preop. Exparel  intra peritoneal.  Technique:  After adequate  anesthesia was achieved the patient was positioned, prepped and draped in usual sterile fashion.  A speculum was placed in the vagina and the cervix dilated with pratt dilators.  The 8 cm Rumi and 2.5 cm Koh ring were assembled and placed in proper fashion.  The  Speculum was removed and the bladder catheterized with a foley.    Attention was turned to the abdomen where a 1 cm incision was made above the umbilicus.  The veress needle was inserted without aspiration of bowel contents or blood.  The long 12 mm trocar was placed and the other three trocar sites were marked out, all approximately 10 cm from each other and the camera. Two 8.5 mm trocars were placed on either side of the camera port.  A 5 mm assistant port was placed 3 cm above the left iliac crest.  All trocars were inserted under direct visualization of the camera.  The patient was placed in trendelenburg and then the Robot docked.  The PK forceps were placed on arm 2 and the Hot shears on arm 1 and introduced under direct visualization of the camera.   I then broke scrub and sat down at the console.  The above findings were noted and the ureters identified well out of the field of dissection.  The right ovary was carefully dissected from the cecum with blunt dissection with occasional sharp with the scissors.  The  Bowel was intact but the area looked narrowed and it was decided that the appendix was adhesed severely to the  cecum.  Hemostasis was fair but throught the case only a small amount of oozing was seen.  A consult with Dr. Johney Maine of General Surgery was called for and we proceeded with the hysterectomy while waiting for him.  Anterior abdominal wall adhesions of omentum and colon were also taken down with sharp cautery and blunt dissection  to clear a path to the area.   The right round ligament was identified and cauterized with the PK.  It  was then divided with the PK cautery and shears.  The peritoneum parallel to the IP ligament was  carefully incised and the posterior broad ligament was then incised under the right IP ligament with care to be above the ureter.  The R IP ligament was cauterized with the PK and then incised with the shears.  The ureter was dissected out and followed to the uterine artery.  The Broad and cardinal ligaments were then cauterized against the cervix to the level of the Koh ring, securing the uterine artery.  Each pedicle was then incised with the shears.  The anterior leaf was then incised at the reflection of the vessico-uterine junction and the lateral bladder retracted inferiorly after the round ligament had been divided with the PK forceps.  The round ligament was cauterized with the PK and divided with the shears;  then the left IP ligament divided with the PK forceps and the scissors in the same manner as the right. The ureter was dissected ant the broad and cardinal ligaments were then cauterized on the left in the same way.   At the level of the internal os, the uterine arteries were bilaterally cauterized with the PK.  The ureters were identified well out of the field of dissection.   The anterior peritoneum was tented up and incised with the monopolar and the bladder retracted inferiorly.  The vesicovaginal fascia was incised with the monopolar shears and then pushed inferiorly off the vagina to about one cm below the Koh ring.   The uterus was amputated at the level of the reflection of the vagina onto the cervix with monopolar cautery and then removed through the vagina.  The pedicles were checked with the gas pressure down and found to be hemostatic.  The vaginal cuff was closed with a running stitch of 2-0 V-Loc.    At this point, Dr. Johney Maine had scrubbed in and determined that the appendix was abnormal and proceeded with Robot assisted appendectomy- this is dictated under a separate report.   Once hemostasis was assured, the Robot was undocked and Interceed and Ropivicaine placed inside the  peritoneal cavity at the cuff.  All instruments and trocars were withdrawn from the abdomen, the abdomen desufflated and the 12 mm fascial incision closed with a figure of eight stitch of 2-0 vicryl.  The skin incisions were closed with 4-0 vicryl R and dermabond.  The cystoscope was placed in the bladder and good spill was seen from the bilateral ureteral orifices.  The bladder was intact.    The patient tolerated the procedure well and was returned to the recovery room in stable condition.

## 2015-08-17 NOTE — Transfer of Care (Signed)
Immediate Anesthesia Transfer of Care Note  Patient: Danielle Davidson  Procedure(s) Performed: Procedure(s): ROBOTIC ASSISTED TOTAL HYSTERECTOMY WITH BILATERAL SALPINGO OOPHORECTOMY (Bilateral) CYSTOSCOPY (N/A) ROBOTIC ASSISTED  APPENDECTOMY  Patient Location: PACU  Anesthesia Type:General  Level of Consciousness: awake, alert  and oriented  Airway & Oxygen Therapy: Patient Spontanous Breathing and Patient connected to nasal cannula oxygen  Post-op Assessment: Report given to RN, Post -op Vital signs reviewed and stable and Patient moving all extremities  Post vital signs: Reviewed and stable  Last Vitals:  Filed Vitals:   08/17/15 0833  BP: 126/71  Pulse: 55  Temp: 36.6 C  Resp: 20    Complications: No apparent anesthesia complications

## 2015-08-17 NOTE — Progress Notes (Signed)
There has been no change in the patients history, status or exam since the history and physical.  Filed Vitals:   08/17/15 0833  BP: 126/71  Pulse: 55  Temp: 97.9 F (36.6 C)  TempSrc: Oral  Resp: 20  SpO2: 100%    Lab Results  Component Value Date   WBC 5.3 08/15/2015   HGB 15.1* 08/15/2015   HCT 44.4 08/15/2015   MCV 90.1 08/15/2015   PLT 196 08/15/2015    HORVATH,MICHELLE A

## 2015-08-17 NOTE — Anesthesia Procedure Notes (Signed)
Procedure Name: Intubation Date/Time: 08/17/2015 10:29 AM Performed by: Hewitt Blade Pre-anesthesia Checklist: Patient identified, Emergency Drugs available, Suction available and Patient being monitored Patient Re-evaluated:Patient Re-evaluated prior to inductionOxygen Delivery Method: Circle system utilized Preoxygenation: Pre-oxygenation with 100% oxygen Intubation Type: IV induction Ventilation: Mask ventilation without difficulty Laryngoscope Size: Mac and 3 Grade View: Grade I Tube type: Oral Tube size: 7.0 mm Number of attempts: 1 Airway Equipment and Method: Stylet Placement Confirmation: ETT inserted through vocal cords under direct vision,  positive ETCO2 and breath sounds checked- equal and bilateral Secured at: 21 cm Tube secured with: Tape Dental Injury: Teeth and Oropharynx as per pre-operative assessment

## 2015-08-17 NOTE — Anesthesia Postprocedure Evaluation (Signed)
Anesthesia Post Note  Patient: Danielle Davidson  Procedure(s) Performed: Procedure(s) (LRB): ROBOTIC ASSISTED TOTAL HYSTERECTOMY WITH BILATERAL SALPINGO OOPHORECTOMY (Bilateral) CYSTOSCOPY (N/A) ROBOTIC ASSISTED  APPENDECTOMY  Anesthesia type: General  Patient location: PACU  Post pain: Pain level controlled  Post assessment: Post-op Vital signs reviewed  Last Vitals:  Filed Vitals:   08/17/15 1545  BP: 108/59  Pulse: 75  Temp:   Resp: 19    Post vital signs: Reviewed  Level of consciousness: sedated  Complications: No apparent anesthesia complications

## 2015-08-17 NOTE — Addendum Note (Signed)
Addendum  created 08/17/15 2112 by Flossie Dibble, CRNA   Modules edited: Notes Section   Notes Section:  File: [id number]

## 2015-08-17 NOTE — Op Note (Signed)
1:37 PM  PATIENT:  Danielle Davidson  49 y.o. 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   INDICATIONS: Patient with concerning symptoms & work up suspicious for dysfunctional uterine bleeding with pelvic pain and abnormal ovarian cyst..  Surgery was recommended by Dr. [name] with gynecology for hysterectomy and salpingo-oophorectomy.  Please see her note for informed consent.  Interoperatively patient seemed to have enlarged/inflamed mass and pelvis adherent to ovary.  This was removed robotically.  However give an abnormal region, urgent intraoperative general surgical consultation requested to make sure there is no evidence of injury or other abnormality.  OR FINDINGS:   The patient was artery status post hysterectomy and bilateral salpingo-oophorectomy.  Had a V lock cuff suture with intact vaginal cuff closure  Rectum not inflamed with no adhesions and no injury.  Right ureter well visualized and viable.  Small bowel and terminal ileum without any significant inflammation.  Cecum and right colon without any significant inflammation and irritation.  Retrocolic inflamed  mass adherent to the cecum consistent with appendix.  Seem to be curled upon itself like a Shepard's crook.   1.5 cm nodule most likely either contained abscess or abnormal tissue Such as a possible carcinoid at the tip/distal half of the curled appendix.  No obvious lymphadenopathy.    DESCRIPTION:   The patient was already in the operating room with the [employer] robot attached.  Patient was artery status post hysterectomy and bilateral salpingo-oophorectomy with the gynecology team in place.  Came into women's remove our 7.  Patient heme and at least stable.  I began with robotically-assisted neck exploration.  Noted that the rectum and small bowel appeared to be without any injury or adherence or other abnormalities.  I could identify the ascending colon as well.  Follow that proximally to the cecum.  We mobilized the terminal ileum to proximal ascending colon in a lateral to medial fashion.  It was apparent that there was some firm thickened area in the ileocecal region.  Again carefully confirmed the terminal ileum with the fold of Treves and follow that proximally several feet to confirm that that was normal ileum.   There is no evidence of adhesions or dilation or abnormality.  Looked healthy and viable.    Eventually rolled over and skeletonized in the ileocecal region and found a side branch off the cecum consistent with the base of the appendix.  Found mesoappendix adherent to the retrocolic region.  I used sharp and blunt dissection with focused scissors cautery to help skeletonize a mobilize the tip of the appendix off the retroperitoneum, staying away from the right ureter and other vital structures.  I took care to avoid injuring any  retroperitoneal structures.  I freed the appendix off its attachments to the ascending colon and cecal mesentery.  I elevated the appendix. I skeletonized the mesoappendix. I was able to free off the base of the appendix which was still viable.  Dr [name] stapled  the appendix off the cecum using a laparoscopic 35mm stapler x 1 firing, taking a healthy cuff of viable cecum.   I ligated the mesoappendix and assured hemostasis in the mesentery.  I placed the appendix inside an EndoCatch bag and Dr [name] removed it out the 12 mm stapler port.  We examined the appendix.  Had a good stapled off base.  3 cm distal to the base was a 1.5 cm well encapsulated smooth firm mass the distal half the appendix was wrapped around like a shepherd's crook.  It was hard to tell if this was the actual tip of the appendix or it was more part of the mesoappendix that the true appendix had wrapped around.  No evidence of rupture or spillage.    We did copious irrigation. Hemostasis was good in the mesoappendix, colon mesentery, and retroperitoneum. Staple line was intact on the cecum with no bleeding. We washed out the pelvis, retrohepatic space and right paracolic gutter.   there is no evidence of mucin visceral peritoneal studding bowel contamination or other abnormality.  Rectum, ureters, small bowel, and colon showed no injury or abnormality.  Hemostasis was good. There was no perforation or injury. Because the area cleaned up well after irrigation, I did not place a drain.    I returned the case back to Dr. [name]. Please see her operative note for details of hysterectomy/oopherectomy/lysis of adhesions/closure.   she plans to watch the patient overnight.  She felt comfortable managing the patient postoperatively but will call our group should there be concerns.  Hopefully not too likely.      Adin Hector, M.D., F.A.C.S. Gastrointestinal and Minimally Invasive Surgery [facility] [address] [place], [place] [address] [phone] Main / Paging

## 2015-08-17 NOTE — Anesthesia Preprocedure Evaluation (Addendum)
Anesthesia Evaluation  Patient identified by MRN, date of birth, ID band Patient awake    Reviewed: Allergy & Precautions, NPO status , Patient's Chart, lab work & pertinent test results  History of Anesthesia Complications Negative for: history of anesthetic complications  Airway Mallampati: II  TM Distance: >3 FB Neck ROM: Full    Dental no notable dental hx. (+) Dental Advisory Given   Pulmonary Current Smoker,    Pulmonary exam normal breath sounds clear to auscultation       Cardiovascular negative cardio ROS Normal cardiovascular exam Rhythm:Regular Rate:Normal     Neuro/Psych negative neurological ROS  negative psych ROS   GI/Hepatic negative GI ROS, Neg liver ROS,   Endo/Other  negative endocrine ROS  Renal/GU negative Renal ROS  negative genitourinary   Musculoskeletal  (+) Arthritis ,   Abdominal   Peds negative pediatric ROS (+)  Hematology negative hematology ROS (+)   Anesthesia Other Findings   Reproductive/Obstetrics negative OB ROS                            Anesthesia Physical Anesthesia Plan  ASA: II  Anesthesia Plan: General   Post-op Pain Management:    Induction: Intravenous  Airway Management Planned: Oral ETT  Additional Equipment:   Intra-op Plan:   Post-operative Plan: Extubation in OR  Informed Consent: I have reviewed the patients History and Physical, chart, labs and discussed the procedure including the risks, benefits and alternatives for the proposed anesthesia with the patient or authorized representative who has indicated his/her understanding and acceptance.   Dental advisory given  Plan Discussed with: CRNA  Anesthesia Plan Comments:         Anesthesia Quick Evaluation

## 2015-08-17 NOTE — Progress Notes (Signed)
Dr. [name] notified of patients 25cc urine output for the past hour.  Urine dark and concentrated.  Orders received.

## 2015-08-17 NOTE — Op Note (Signed)
08/17/2015  1:54 PM  PATIENT:  Danielle Davidson  49 y.o. female  PRE-OPERATIVE DIAGNOSIS:  OVARIAN CYST, PELVIC PAIN, DUB  POST-OPERATIVE DIAGNOSIS:  ovarian cyst, pelvic pain, DUB  PROCEDURE:  Procedure(s): ROBOTIC ASSISTED TOTAL HYSTERECTOMY WITH BILATERAL SALPINGO OOPHORECTOMY (Bilateral) CYSTOSCOPY (N/A) ROBOTIC ASSISTED  APPENDECTOMY  SURGEON:  Surgeon(s) and Role:    * Bobbye Charleston, MD - Primary    * Allyn Kenner, DO - Assisting    * Michael Boston, MD - Assisting   ANESTHESIA:   general  EBL:  Total I/O In: 1000 [I.V.:1000] Out: 300 [Urine:100; Blood:200]   LOCAL MEDICATIONS USED:  OTHER Ropivicaine into pelvis; interceed  SPECIMEN:  Source of Specimen:  uterus, cervix, ovaries, tubes and appendix  DISPOSITION OF SPECIMEN:  PATHOLOGY  COUNTS:  YES  TOURNIQUET:  * No tourniquets in log *  DICTATION: .Note written in EPIC  PLAN OF CARE: Admit for overnight observation  PATIENT DISPOSITION:  PACU - hemodynamically stable.   Delay start of Pharmacological VTE agent (>24hrs) due to surgical blood loss or risk of bleeding: not applicable  Complications:  None.  Findings: 8 weeks size uterus.  Left ovary was normal.  Right ovary was slightly enlarged with multiple clear cysts.  The L tube was dilated and wrapped around the ovary.  The bowel at the junction of the cecum was adhesed to other bowel and the right ovary.  The omentum was adhesed with the descending colon to the anterior abdominal wall. The appendix appeared abnormal and adhesed to the colon.  Dr. Johney Maine was asked to evaluated and he determined that the appendix needed to be removed. The ureters were dissected out and identified during multiple points of the case and were always out of the field of dissection.  On cystoscopy, the bladder was intact and bilateral spill was seen from each ureteral oriface.    Medications:  Ancef. Pyridium preop. Exparel  intra peritoneal.  Technique:  After adequate  anesthesia was achieved the patient was positioned, prepped and draped in usual sterile fashion.  A speculum was placed in the vagina and the cervix dilated with pratt dilators.  The 8 cm Rumi and 2.5 cm Koh ring were assembled and placed in proper fashion.  The  Speculum was removed and the bladder catheterized with a foley.    Attention was turned to the abdomen where a 1 cm incision was made above the umbilicus.  The veress needle was inserted without aspiration of bowel contents or blood.  The long 12 mm trocar was placed and the other three trocar sites were marked out, all approximately 10 cm from each other and the camera. Two 8.5 mm trocars were placed on either side of the camera port.  A 5 mm assistant port was placed 3 cm above the left iliac crest.  All trocars were inserted under direct visualization of the camera.  The patient was placed in trendelenburg and then the Robot docked.  The PK forceps were placed on arm 2 and the Hot shears on arm 1 and introduced under direct visualization of the camera.   I then broke scrub and sat down at the console.  The above findings were noted and the ureters identified well out of the field of dissection.  The right ovary was carefully dissected from the cecum with blunt dissection with occasional sharp with the scissors.  The  Bowel was intact but the area looked narrowed and it was decided that it may be the the appendix which  was adhesed severely to the cecum.  Hemostasis was fair but throughout the case only a small amount of oozing was seen.  A consult with Dr. Johney Maine of General Surgery was called for and we proceeded with the hysterectomy while waiting for him.  Anterior abdominal wall adhesions of omentum and colon were also taken down with sharp cautery and blunt dissection  to clear a path to the area.   The right round ligament was identified and cauterized with the PK.  It  was then divided with the PK cautery and shears.  The peritoneum parallel to  the IP ligament was carefully incised and the posterior broad ligament was then incised under the right IP ligament with care to be above the ureter.  The R IP ligament was cauterized with the PK and then incised with the shears.  The ureter was dissected out and followed to the uterine artery.  The Broad and cardinal ligaments were then cauterized against the cervix to the level of the Koh ring, securing the uterine artery.  Each pedicle was then incised with the shears.  The anterior leaf was then incised at the reflection of the vessico-uterine junction and the lateral bladder retracted inferiorly after the round ligament had been divided with the PK forceps.  The round ligament was cauterized with the PK and divided with the shears;  then the left IP ligament divided with the PK forceps and the scissors in the same manner as the right. The ureter was dissected ant the broad and cardinal ligaments were then cauterized on the left in the same way.   At the level of the internal os, the uterine arteries were bilaterally cauterized with the PK.  The ureters were identified well out of the field of dissection.   The anterior peritoneum was tented up and incised with the monopolar and the bladder retracted inferiorly.  The vesicovaginal fascia was incised with the monopolar shears and then pushed inferiorly off the vagina to about one cm below the Koh ring.   The uterus was amputated at the level of the reflection of the vagina onto the cervix with monopolar cautery and then removed through the vagina.  The pedicles were checked with the gas pressure down and found to be hemostatic.  The vaginal cuff was closed with a running stitch of 2-0 V-Loc.    At this point, Dr. Johney Maine had scrubbed in and determined that the appendix was abnormal and proceeded with Robot assisted appendectomy- this is dictated under a separate report.   Once hemostasis was assured, the Robot was undocked and Interceed and Ropivicaine  placed inside the peritoneal cavity at the cuff.  All instruments and trocars were withdrawn from the abdomen, the abdomen desufflated and both of the 12 mm fascial incisions were closed with one to two figure of eight stitches  of 2-0 vicryl.  The skin incisions were closed with 4-0 vicryl R and dermabond.  The cystoscope was placed in the bladder and good spill was seen from the bilateral ureteral orifices.  The bladder was intact.    The patient tolerated the procedure well and was returned to the recovery room in stable condition.

## 2015-08-17 NOTE — Discharge Instructions (Signed)
LAPAROSCOPIC SURGERY: POST OP INSTRUCTIONS ° °1. DIET: Follow a light bland diet the first 24 hours after arrival home, such as soup, liquids, crackers, etc.  Be sure to include lots of fluids daily.  Avoid fast food or heavy meals as your are more likely to get nauseated.  Eat a low fat the next few days after surgery.   °2. Take your usually prescribed home medications unless otherwise directed. °3. PAIN CONTROL: °a. Pain is best controlled by a usual combination of three different methods TOGETHER: °i. Ice/Heat °ii. Over the counter pain medication °iii. Prescription pain medication °b. Most patients will experience some swelling and bruising around the incisions.  Ice packs or heating pads (30-60 minutes up to 6 times a day) will help. Use ice for the first few days to help decrease swelling and bruising, then switch to heat to help relax tight/sore spots and speed recovery.  Some people prefer to use ice alone, heat alone, alternating between ice & heat.  Experiment to what works for you.  Swelling and bruising can take several weeks to resolve.   °c. It is helpful to take an over-the-counter pain medication regularly for the first few weeks.  Choose one of the following that works best for you: °i. Naproxen (Aleve, etc)  Two 220mg tabs twice a day °ii. Ibuprofen (Advil, etc) Three 200mg tabs four times a day (every meal & bedtime) °iii. Acetaminophen (Tylenol, etc) 500-650mg four times a day (every meal & bedtime) °d. A  prescription for pain medication (such as oxycodone, hydrocodone, etc) should be given to you upon discharge.  Take your pain medication as prescribed.  °i. If you are having problems/concerns with the prescription medicine (does not control pain, nausea, vomiting, rash, itching, etc), please call us [phone] to see if we need to switch you to a different pain medicine that will work better for you and/or control your side effect better. °ii. If you need a refill on your pain medication,  please contact your pharmacy.  They will contact our office to request authorization. Prescriptions will not be filled after 5 pm or on week-ends. °4. Avoid getting constipated.  Between the surgery and the pain medications, it is common to experience some constipation.  Increasing fluid intake and taking a fiber supplement (such as Metamucil, Citrucel, FiberCon, MiraLax, etc) 1-2 times a day regularly will usually help prevent this problem from occurring.  A mild laxative (prune juice, Milk of Magnesia, MiraLax, etc) should be taken according to package directions if there are no bowel movements after 48 hours.   °5. Watch out for diarrhea.  If you have many loose bowel movements, simplify your diet to bland foods & liquids for a few days.  Stop any stool softeners and decrease your fiber supplement.  Switching to mild anti-diarrheal medications (Kayopectate, Pepto Bismol) can help.  If this worsens or does not improve, please call us. °6. Wash / shower every day.  You may shower over the dressings as they are waterproof.  Continue to shower over incision(s) after the dressing is off. °7. Remove your waterproof bandages 5 days after surgery.  You may leave the incision open to air.  You may replace a dressing/Band-Aid to cover the incision for comfort if you wish.  °8. ACTIVITIES as tolerated:   °a. You may resume regular (light) daily activities beginning the next day--such as daily self-care, walking, climbing stairs--gradually increasing activities as tolerated.  If you can walk 30 minutes without difficulty, it   is safe to try more intense activity such as jogging, treadmill, bicycling, low-impact aerobics, swimming, etc. °b. Save the most intensive and strenuous activity for last such as sit-ups, heavy lifting, contact sports, etc  Refrain from any heavy lifting or straining until you are off narcotics for pain control.   °c. DO NOT PUSH THROUGH PAIN.  Let pain be your guide: If it hurts to do something, don't  do it.  Pain is your body warning you to avoid that activity for another week until the pain goes down. °d. You may drive when you are no longer taking prescription pain medication, you can comfortably wear a seatbelt, and you can safely maneuver your car and apply brakes. °e. You may have sexual intercourse when it is comfortable.  °9. FOLLOW UP in our office °a. Please call [facility] at [phone] to set up an appointment to see your surgeon in the office for a follow-up appointment approximately 2-3 weeks after your surgery. °b. Make sure that you call for this appointment the day you arrive home to insure a convenient appointment time. °10. IF YOU HAVE DISABILITY OR FAMILY LEAVE FORMS, BRING THEM TO THE OFFICE FOR PROCESSING.  DO NOT GIVE THEM TO YOUR DOCTOR. ° ° °WHEN TO CALL US [phone]: °1. Poor pain control °2. Reactions / problems with new medications (rash/itching, nausea, etc)  °3. Fever over 101.5 F (38.5 C) °4. Inability to urinate °5. Nausea and/or vomiting °6. Worsening swelling or bruising °7. Continued bleeding from incision. °8. Increased pain, redness, or drainage from the incision ° ° The clinic staff is available to answer your questions during regular business hours (8:30am-5pm).  Please don’t hesitate to call and ask to speak to one of our nurses for clinical concerns.  ° If you have a medical emergency, go to the nearest emergency room or call 911. ° A surgeon from [facility] is always on call at the hospitals ° ° °[facility] °[address], [address], [place], [place]  [address] ? °MAIN: [phone] ? TOLL FREE: [phone] ?  °FAX [phone] °[email or web address] ° °Managing Pain ° °Pain after surgery or related to activity is often due to strain/injury to muscle, tendon, nerves and/or incisions.  This pain is usually short-term and will improve in a few months.  ° °Many people find it helpful to do the following things TOGETHER  to help speed the process of healing and to get back to regular activity more quickly: ° °1. Avoid heavy physical activity at first °a. No lifting greater than 20 pounds at first, then increase to lifting as tolerated over the next few weeks °b. Do not “push through” the pain.  Listen to your body and avoid positions and maneuvers than reproduce the pain.  Wait a few days before trying something more intense °c. Walking is okay as tolerated, but go slowly and stop when getting sore.  If you can walk 30 minutes without stopping or pain, you can try more intense activity (running, jogging, aerobics, cycling, swimming, treadmill, sex, sports, weightlifting, etc ) °d. Remember: If it hurts to do it, then don’t do it! ° °2. Take Anti-inflammatory medication °a. Choose ONE of the following over-the-counter medications: °i.            Acetaminophen 500mg tabs (Tylenol) 1-2 pills with every meal and just before bedtime (avoid if you have liver problems) °ii.            Naproxen 220mg   tabs (ex. Aleve) 1-2 pills twice a day (avoid if you have kidney, stomach, IBD, or bleeding problems) °iii. Ibuprofen 200mg tabs (ex. Advil, Motrin) 3-4 pills with every meal and just before bedtime (avoid if you have kidney, stomach, IBD, or bleeding problems) °b. Take with food/snack around the clock for 1-2 weeks °i. This helps the muscle and nerve tissues become less irritable and calm down faster ° °3. Use a Heating pad or Ice/Cold Pack °a. 4-6 times a day °b. May use warm bath/hottub  or showers ° °4. Try Gentle Massage and/or Stretching  °a. at the area of pain many times a day °b. stop if you feel pain - do not overdo it ° °Try these steps together to help you body heal faster and avoid making things get worse.  Doing just one of these things may not be enough.   ° °If you are not getting better after two weeks or are noticing you are getting worse, contact our office for further advice; we may need to re-evaluate you & see what other  things we can do to help. ° °GETTING TO GOOD BOWEL HEALTH. °Irregular bowel habits such as constipation and diarrhea can lead to many problems over time.  Having one soft bowel movement a day is the most important way to prevent further problems.  The anorectal canal is designed to handle stretching and feces to safely manage our ability to get rid of solid waste (feces, poop, stool) out of our body.  BUT, hard constipated stools can act like ripping concrete bricks and diarrhea can be a burning fire to this very sensitive area of our body, causing inflamed hemorrhoids, anal fissures, increasing risk is perirectal abscesses, abdominal pain/bloating, an making irritable bowel worse.     ° °The goal: ONE SOFT BOWEL MOVEMENT A DAY!  To have soft, regular bowel movements:  °• Drink plenty of fluids, consider 4-6 tall glasses of water a day.   °• Take plenty of fiber.  Fiber is the undigested part of plant food that passes into the colon, acting s “natures broom” to encourage bowel motility and movement.  Fiber can absorb and hold large amounts of water. This results in a larger, bulkier stool, which is soft and easier to pass. Work gradually over several weeks up to 6 servings a day of fiber (25g a day even more if needed) in the form of: °o Vegetables -- Root (potatoes, carrots, turnips), leafy green (lettuce, salad greens, celery, spinach), or cooked high residue (cabbage, broccoli, etc) °o Fruit -- Fresh (unpeeled skin & pulp), Dried (prunes, apricots, cherries, etc ),  or stewed ( applesauce)  °o Whole grain breads, pasta, etc (whole wheat)  °o Bran cereals  °• Bulking Agents -- This type of water-retaining fiber generally is easily obtained each day by one of the following:  °o Psyllium bran -- The psyllium plant is remarkable because its ground seeds can retain so much water. This product is available as Metamucil, Konsyl, Effersyllium, Per Diem Fiber, or the less expensive generic preparation in drug and health  food stores. Although labeled a laxative, it really is not a laxative.  °o Methylcellulose -- This is another fiber derived from wood which also retains water. It is available as Citrucel. °o Polyethylene Glycol - and “artificial” fiber commonly called Miralax or Glycolax.  It is helpful for people with gassy or bloated feelings with regular fiber °o Flax Seed - a less gassy fiber than psyllium °• No reading or   other relaxing activity while on the toilet. If bowel movements take longer than 5 minutes, you are too constipated  AVOID CONSTIPATION.  High fiber and water intake usually takes care of this.  Sometimes a laxative is needed to stimulate more frequent bowel movements, but   Laxatives are not a good long-term solution as it can wear the colon out.  They can help jump-start bowels if constipated, but should be relied on constantly without discussing with your doctor o Osmotics (Milk of Magnesia, Fleets phosphosoda, Magnesium citrate, MiraLax, GoLytely) are safer than  o Stimulants (Senokot, Castor Oil, Dulcolax, Ex Lax)    o Avoid taking laxatives for more than 7 days in a row.   IF SEVERELY CONSTIPATED, try a Bowel Retraining Program: o Do not use laxatives.  o Eat a diet high in roughage, such as bran cereals and leafy vegetables.  o Drink six (6) ounces of prune or apricot juice each morning.  o Eat two (2) large servings of stewed fruit each day.  o Take one (1) heaping tablespoon of a psyllium-based bulking agent twice a day. Use sugar-free sweetener when possible to avoid excessive calories.  o Eat a normal breakfast.  o Set aside 15 minutes after breakfast to sit on the toilet, but do not strain to have a bowel movement.  o If you do not have a bowel movement by the third day, use an enema and repeat the above steps.   Controlling diarrhea o Switch to liquids and simpler foods for a few days to avoid stressing your intestines further. o Avoid dairy products (especially milk & ice  cream) for a short time.  The intestines often can lose the ability to digest lactose when stressed. o Avoid foods that cause gassiness or bloating.  Typical foods include beans and other legumes, cabbage, broccoli, and dairy foods.  Every person has some sensitivity to other foods, so listen to our body and avoid those foods that trigger problems for you. o Adding fiber (Citrucel, Metamucil, psyllium, Miralax) gradually can help thicken stools by absorbing excess fluid and retrain the intestines to act more normally.  Slowly increase the dose over a few weeks.  Too much fiber too soon can backfire and cause cramping & bloating. o Probiotics (such as active yogurt, Align, etc) may help repopulate the intestines and colon with normal bacteria and calm down a sensitive digestive tract.  Most studies show it to be of mild help, though, and such products can be costly. o Medicines: - Bismuth subsalicylate (ex. Kayopectate, Pepto Bismol) every 30 minutes for up to 6 doses can help control diarrhea.  Avoid if pregnant. - Loperamide (Immodium) can slow down diarrhea.  Start with two tablets (4mg  total) first and then try one tablet every 6 hours.  Avoid if you are having fevers or severe pain.  If you are not better or start feeling worse, stop all medicines and call your doctor for advice o Call your doctor if you are getting worse or not better.  Sometimes further testing (cultures, endoscopy, X-ray studies, bloodwork, etc) may be needed to help diagnose and treat the cause of the diarrhea.  TROUBLESHOOTING IRREGULAR BOWELS 1) Avoid extremes of bowel movements (no bad constipation/diarrhea) 2) Miralax 17gm mixed in 8oz. water or juice-daily. May use BID as needed.  3) Gas-x,Phazyme, etc. as needed for gas & bloating.  4) Soft,bland diet. No spicy,greasy,fried foods.  5) Prilosec over-the-counter as needed  6) May hold gluten/wheat products from diet to see  if symptoms improve.  7)  May try probiotics  (Align, Activa, etc) to help calm the bowels down 7) If symptoms become worse call back immediately.

## 2015-08-17 NOTE — Anesthesia Postprocedure Evaluation (Signed)
Anesthesia Post Note  Patient: Danielle Davidson  Procedure(s) Performed: Procedure(s): ROBOTIC ASSISTED TOTAL HYSTERECTOMY WITH BILATERAL SALPINGO OOPHORECTOMY (Bilateral) CYSTOSCOPY (N/A) ROBOTIC ASSISTED  APPENDECTOMY  Anesthesia type: General  Patient location: [facility]  Post pain: Pain level controlled  Post assessment: Post-op Vital signs reviewed  Last Vitals: BP 108/62 mmHg  Pulse 60  Temp(Src) 36.9 C (Oral)  Resp 16  Ht 5' 5.5" (1.664 m)  Wt 137 lb 5 oz (62.285 kg)  BMI 22.49 kg/m2  SpO2 100%  Post vital signs: Reviewed  Level of consciousness: awake  Complications: No apparent anesthesia complications

## 2015-08-18 ENCOUNTER — Encounter (HOSPITAL_COMMUNITY): Payer: Self-pay | Admitting: Obstetrics and Gynecology

## 2015-08-18 DIAGNOSIS — N830 Follicular cyst of ovary: Secondary | ICD-10-CM | POA: Diagnosis not present

## 2015-08-18 MED ORDER — OXYCODONE-ACETAMINOPHEN 5-325 MG PO TABS: 1.0000 | ORAL_TABLET | ORAL | Status: DC | PRN

## 2015-08-18 NOTE — Progress Notes (Signed)
Discharge teaching complete. Pt understood all instructions and did not have any questions. Pt ambulated out of the hospital and did not have any questions.

## 2015-08-18 NOTE — Discharge Summary (Addendum)
Physician Discharge Summary  Patient ID: Danielle Davidson MRN: [id number] DOB/AGE: [birth year] 49 y.o.  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 rate 18, height 5' 5.5" (1.664 m), weight 62.285 kg (137 lb 5 oz), SpO2 100 %.   Disposition: good, to home  Discharge Instructions    Call MD for:  temperature >100.4    Complete by:  As directed      Call MD for:  temperature >100.4    Complete by:  As directed      Diet - low sodium heart healthy    Complete by:  As directed      Diet - low sodium heart healthy    Complete by:  As directed      Discharge instructions    Complete by:  As directed   No driving on narcotics, no sexual activity for 2 weeks.     Discharge instructions    Complete by:  As directed   No driving on narcotics, no sexual activity for 2 weeks.     Increase activity slowly    Complete by:  As directed      Increase activity slowly    Complete by:  As directed      May shower / Bathe    Complete by:  As directed   Shower, no bath for 2 weeks.     May shower / Bathe    Complete by:  As directed   Shower, no bath for 2 weeks.     Remove dressing in 24 hours    Complete by:  As directed      Remove dressing in 24 hours    Complete by:  As directed      Sexual Activity Restrictions    Complete by:  As directed   No sexual activity for 2 weeks.     Sexual Activity Restrictions    Complete by:  As directed    No sexual activity for 2 weeks.            Medication List    TAKE these medications        fexofenadine 60 MG tablet  Commonly known as:  ALLEGRA  Take 60 mg by mouth daily.     ibuprofen 200 MG tablet  Commonly known as:  ADVIL,MOTRIN  Take 200 mg by mouth every 6 (six) hours as needed for headache.     oxyCODONE-acetaminophen 5-325 MG tablet  Commonly known as:  PERCOCET/ROXICET  Take 1-2 tablets by mouth every 4 (four) hours as needed for severe pain (moderate to severe pain (when tolerating fluids)).      Pt needs to take Ibuprofen for 7 days regularly. Pt to continue RX of Nicotine patch at home. If pt has severe symptoms of menopause, can call to start ERT.     Follow-up Information    Follow up with Adin Hector., MD.   Specialty:  General Surgery   Why:  See [place] only as needed   Contact information:   [address] [place] [address] [phone]  Follow up with HORVATH,MICHELLE A, MD In 4 weeks.   Specialty:  Obstetrics and Gynecology   Contact information:   [address] [place] [address] [phone]     correction- 2 weeks.  Signed: HORVATH,MICHELLE A 08/18/2015, 7:49 AM

## 2015-08-18 NOTE — Progress Notes (Signed)
Patient is eating, ambulating, and voiding.  Pain control is good.  BP 98/58 mmHg  Pulse 56  Temp(Src) 98.3 F (36.8 C) (Oral)  Resp 18  Ht 5' 5.5" (1.664 m)  Wt 62.285 kg (137 lb 5 oz)  BMI 22.49 kg/m2  SpO2 100%  lungs:   clear to auscultation cor:    RRR Abdomen:  soft, appropriate tenderness, incisions intact and without erythema or exudate. ex:    no cords   Lab Results  Component Value Date   WBC 5.3 08/15/2015   HGB 15.1* 08/15/2015   HCT 44.4 08/15/2015   MCV 90.1 08/15/2015   PLT 196 08/15/2015    A/P  Routine care.  Expect d/c per plan.

## 2015-10-23 ENCOUNTER — Ambulatory Visit (INDEPENDENT_AMBULATORY_CARE_PROVIDER_SITE_OTHER): Payer: 59 | Admitting: Family Medicine

## 2015-10-23 VITALS — BP 102/64 | HR 74 | Temp 98.4°F | Resp 16 | Ht 66.0 in | Wt 145.0 lb

## 2015-10-23 DIAGNOSIS — Z716 Tobacco abuse counseling: Secondary | ICD-10-CM | POA: Diagnosis not present

## 2015-10-23 DIAGNOSIS — Z720 Tobacco use: Secondary | ICD-10-CM | POA: Diagnosis not present

## 2015-10-23 MED ORDER — ALPRAZOLAM 0.25 MG PO TABS: 0.2500 mg | ORAL_TABLET | Freq: Two times a day (BID) | ORAL | Status: DC | PRN

## 2015-10-23 NOTE — Progress Notes (Signed)
Subjective:  This chart was scribed for Delman Cheadle, MD by Moises Blood, Medical Scribe. This patient was seen in Room 9 and the patient's care was started 9:09 AM.   Patient ID: Danielle Davidson, female    DOB: 06-Mar-1966, 49 y.o.   MRN: [id number] Chief Complaint  Patient presents with  . Anxiety    related to smoking sensation  . Depression    per screening   HPI Danielle Davidson is a 49 y.o. female who presents to [facility] complaining of anxiety related to smoking cessation.  History Last saw pt 2 years ago, she developed acute bronchitis; chest xray showed very reduced flow suggested she had developed COPD. Lungs were hyper inflated. Peak flow at the time of bronchitis was 400, with goal of 467. Therefore pt returned to clinic to have full pulmonary function test, which were normal. No respiratory symptoms at baseline. So pt reassured she had not developed COPD. She planned to continue smoking with half pack a day with no plan of cessation at that time.   Anxiety She had total hysterectomy in sept and decided to stop smoking. She decided that she'd be asleep during surgery and too weak to buy more. She's currently been 9.5 weeks without cigarette. But, she noticed having a hard time without patches. She's trying to step down on the patches, from 14 mg to 7 mg, starting yesterday. Pt notes that she tried to quit in the past when her partner stopped smoking back in the beginning of 2008. Her partner was successful but the pt continued to smoke after becoming very anxious after 3 weeks. She was prescribed welbutrin for a month. She denies shortness of breath.   Depression Pt states having depression since high school and has been coping with it by keeping herself busy. She was prescribed xanax and notes that it didn't sedate her. She also mentions recently losing a friend. So, with recent stress, she had 3 episodes of panic attack.   Surgery When she had her hysterectomy, they noticed inflamed  and enlarged appendix. 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   Past Medical History  Diagnosis Date  . Allergy   . Arthritis    Prior to Admission medications   Medication Sig Start Date End Date Taking? Authorizing Provider  fexofenadine (ALLEGRA) 60 MG tablet Take 60 mg by mouth daily.   Yes Historical Provider, MD  ibuprofen (ADVIL,MOTRIN) 200 MG tablet Take 200 mg by mouth every 6 (six) hours as needed for headache.   Yes Historical Provider, MD  oxyCODONE-acetaminophen (PERCOCET/ROXICET) 5-325 MG tablet Take 1-2 tablets by mouth every 4 (four) hours as needed for severe pain (moderate to severe pain (when tolerating fluids)). Patient not taking: Reported on 10/23/2015 08/18/15   Bobbye Charleston, MD   Allergies  Allergen Reactions  . Codeine Nausea Only    Review of Systems  Constitutional: Negative for fatigue and unexpected weight change.  Respiratory: Negative for chest tightness and shortness of breath.   Cardiovascular: Negative for chest pain, palpitations and leg swelling.  Gastrointestinal: Negative for abdominal pain and blood in stool.  Neurological: Negative for dizziness, syncope, light-headedness and headaches.  Psychiatric/Behavioral: The patient is nervous/anxious.        Objective:   Physical Exam  Constitutional: She is oriented to person, place, and time. She appears well-developed and well-nourished. No distress.  HENT:  Head: Normocephalic and atraumatic.  Eyes: EOM are normal. Pupils are equal, round,  and reactive to light.  Neck: Neck supple.  Cardiovascular: Normal rate, regular rhythm, S1 normal, S2 normal and normal heart sounds.   No murmur heard. Pulmonary/Chest: Effort normal and breath sounds normal. No respiratory distress. She has no wheezes.  Musculoskeletal: Normal range of motion.  Neurological: She is alert and oriented to person, place, and time.  Skin: Skin is warm and dry.  Psychiatric: She has a  normal mood and affect. Her behavior is normal.  Nursing note and vitals reviewed.   BP 102/64 mmHg  Pulse 74  Temp(Src) 98.4 F (36.9 C)  Resp 16  Ht 5\' 6"  (1.676 m)  Wt 145 lb (65.772 kg)  BMI 23.41 kg/m2  SpO2 99%  LMP 08/13/2012     Assessment & Plan:   1. Encounter for smoking cessation counseling   Responded very well to xanax prev so will try again - wellbutrin caused som increased SI with plan so will avoid.  If needs for xanax continues long term or becomes frequent, would want to consider addition of a low dose maintenance med - poss zoloft or effexor.   Meds ordered this encounter  Medications  . ALPRAZolam (XANAX) 0.25 MG tablet    Sig: Take 1 tablet (0.25 mg total) by mouth 2 (two) times daily as needed for anxiety.    Dispense:  40 tablet    Refill:  0    I personally performed the services described in this documentation, which was scribed in my presence. The recorded information has been reviewed and considered, and addended by me as needed.  Delman Cheadle, MD MPH    By signing my name below, I, Moises Blood, attest that this documentation has been prepared under the direction and in the presence of Delman Cheadle, MD. Electronically Signed: Moises Blood, [place]. 10/23/2015 , 9:09 AM .

## 2015-10-23 NOTE — Patient Instructions (Signed)
Smoking Cessation, Tips for Success If you are ready to quit smoking, congratulations! You have chosen to help yourself be healthier. Cigarettes bring nicotine, tar, carbon monoxide, and other irritants into your body. Your lungs, heart, and blood vessels will be able to work better without these poisons. There are many different ways to quit smoking. Nicotine gum, nicotine patches, a nicotine inhaler, or nicotine nasal spray can help with physical craving. Hypnosis, support groups, and medicines help break the habit of smoking. WHAT THINGS CAN I DO TO MAKE QUITTING EASIER?  Here are some tips to help you quit for good:  Pick a date when you will quit smoking completely. Tell all of your friends and family about your plan to quit on that date.  Do not try to slowly cut down on the number of cigarettes you are smoking. Pick a quit date and quit smoking completely starting on that day.  Throw away all cigarettes.   Clean and remove all ashtrays from your home, work, and car.  On a card, write down your reasons for quitting. Carry the card with you and read it when you get the urge to smoke.  Cleanse your body of nicotine. Drink enough water and fluids to keep your urine clear or pale yellow. Do this after quitting to flush the nicotine from your body.  Learn to predict your moods. Do not let a bad situation be your excuse to have a cigarette. Some situations in your life might tempt you into wanting a cigarette.  Never have "just one" cigarette. It leads to wanting another and another. Remind yourself of your decision to quit.  Change habits associated with smoking. If you smoked while driving or when feeling stressed, try other activities to replace smoking. Stand up when drinking your coffee. Brush your teeth after eating. Sit in a different chair when you read the paper. Avoid alcohol while trying to quit, and try to drink fewer caffeinated beverages. Alcohol and caffeine may urge you to  smoke.  Avoid foods and drinks that can trigger a desire to smoke, such as sugary or spicy foods and alcohol.  Ask people who smoke not to smoke around you.  Have something planned to do right after eating or having a cup of coffee. For example, plan to take a walk or exercise.  Try a relaxation exercise to calm you down and decrease your stress. Remember, you may be tense and nervous for the first 2 weeks after you quit, but this will pass.  Find new activities to keep your hands busy. Play with a pen, coin, or rubber band. Doodle or draw things on paper.  Brush your teeth right after eating. This will help cut down on the craving for the taste of tobacco after meals. You can also try mouthwash.   Use oral substitutes in place of cigarettes. Try using lemon drops, carrots, cinnamon sticks, or chewing gum. Keep them handy so they are available when you have the urge to smoke.  When you have the urge to smoke, try deep breathing.  Designate your home as a nonsmoking area.  If you are a heavy smoker, ask your health care provider about a prescription for nicotine chewing gum. It can ease your withdrawal from nicotine.  Reward yourself. Set aside the cigarette money you save and buy yourself something nice.  Look for support from others. Join a support group or smoking cessation program. Ask someone at home or at work to help you with your plan   to quit smoking.  Always ask yourself, "Do I need this cigarette or is this just a reflex?" Tell yourself, "Today, I choose not to smoke," or "I do not want to smoke." You are reminding yourself of your decision to quit.  Do not replace cigarette smoking with electronic cigarettes (commonly called e-cigarettes). The safety of e-cigarettes is unknown, and some may contain harmful chemicals.  If you relapse, do not give up! Plan ahead and think about what you will do the next time you get the urge to smoke. HOW WILL I FEEL WHEN I QUIT SMOKING? You  may have symptoms of withdrawal because your body is used to nicotine (the addictive substance in cigarettes). You may crave cigarettes, be irritable, feel very hungry, cough often, get headaches, or have difficulty concentrating. The withdrawal symptoms are only temporary. They are strongest when you first quit but will go away within 10-14 days. When withdrawal symptoms occur, stay in control. Think about your reasons for quitting. Remind yourself that these are signs that your body is healing and getting used to being without cigarettes. Remember that withdrawal symptoms are easier to treat than the major diseases that smoking can cause.  Even after the withdrawal is over, expect periodic urges to smoke. However, these cravings are generally short lived and will go away whether you smoke or not. Do not smoke! WHAT RESOURCES ARE AVAILABLE TO HELP ME QUIT SMOKING? Your health care provider can direct you to community resources or hospitals for support, which may include:  Group support.  Education.  Hypnosis.  Therapy.   This information is not intended to replace advice given to you by your health care provider. Make sure you discuss any questions you have with your health care provider.   Document Released: 08/03/2004 Document Revised: 11/26/2014 Document Reviewed: 04/23/2013 Elsevier Interactive Patient Education 2016 [employer] Inc.  

## 2015-12-29 ENCOUNTER — Ambulatory Visit (INDEPENDENT_AMBULATORY_CARE_PROVIDER_SITE_OTHER): Payer: 59 | Admitting: Family Medicine

## 2015-12-29 ENCOUNTER — Encounter: Payer: Self-pay | Admitting: Family Medicine

## 2015-12-29 VITALS — BP 130/84 | HR 84 | Temp 98.1°F | Resp 16 | Ht 66.0 in | Wt 152.0 lb

## 2015-12-29 DIAGNOSIS — E079 Disorder of thyroid, unspecified: Secondary | ICD-10-CM

## 2015-12-29 DIAGNOSIS — E0789 Other specified disorders of thyroid: Secondary | ICD-10-CM

## 2015-12-29 LAB — COMPREHENSIVE METABOLIC PANEL
ALBUMIN: 4.1 g/dL (ref 3.6–5.1)
ALT: 26 U/L (ref 6–29)
AST: 20 U/L (ref 10–35)
Alkaline Phosphatase: 91 U/L (ref 33–115)
BILIRUBIN TOTAL: 0.4 mg/dL (ref 0.2–1.2)
BUN: 9 mg/dL (ref 7–25)
CALCIUM: 9.3 mg/dL (ref 8.6–10.2)
CO2: 27 mmol/L (ref 20–31)
CREATININE: 0.74 mg/dL (ref 0.50–1.10)
Chloride: 101 mmol/L (ref 98–110)
Glucose, Bld: 77 mg/dL (ref 65–99)
POTASSIUM: 4.2 mmol/L (ref 3.5–5.3)
Sodium: 139 mmol/L (ref 135–146)
Total Protein: 7.5 g/dL (ref 6.1–8.1)

## 2015-12-29 LAB — CBC
HEMATOCRIT: 45.1 % (ref 36.0–46.0)
HEMOGLOBIN: 14.8 g/dL (ref 12.0–15.0)
MCH: 28.8 pg (ref 26.0–34.0)
MCHC: 32.8 g/dL (ref 30.0–36.0)
MCV: 87.9 fL (ref 78.0–100.0)
MPV: 9.1 fL (ref 8.6–12.4)
Platelets: 248 10*3/uL (ref 150–400)
RBC: 5.13 MIL/uL — ABNORMAL HIGH (ref 3.87–5.11)
RDW: 13.8 % (ref 11.5–15.5)
WBC: 6.3 10*3/uL (ref 4.0–10.5)

## 2015-12-29 LAB — THYROID PANEL WITH TSH
Free Thyroxine Index: 2.4 (ref 1.4–3.8)
T3 UPTAKE: 28 % (ref 22–35)
T4 TOTAL: 8.6 ug/dL (ref 4.5–12.0)
TSH: 2.23 mIU/L

## 2015-12-29 NOTE — Progress Notes (Addendum)
Subjective:    Patient ID: Danielle Davidson, female    DOB: 1966/10/15, 50 y.o.   MRN: [id number] Chief Complaint  Patient presents with  . Thyroid Problem    HPI Here to discuss a lesion on her thyroid that was found during a MRI of a cervical spine which was done by her specialist in eval of her bilateral arm numbness.  Trouble swallowing pills on the left  Uncle on dad had thyroid cancer in 106s and lived to 12s  Wife is donna sosnick. Had hysterectomy - has always been a little heavier Still can with sleep with neck going down her left arm - treated with prednisone and caused severe side effects Took a flexeril at night a few times a wk. Hot flashes Still having numbness in her left leg. No weakness.   Doing well on the zoloft.  Past Medical History  Diagnosis Date  . Allergy   . Arthritis    Past Surgical History  Procedure Laterality Date  . Wisdom tooth extraction    . Egg donation    . Robotic assisted total hysterectomy with bilateral salpingo oopherectomy Bilateral 08/17/2015    Procedure: ROBOTIC ASSISTED TOTAL HYSTERECTOMY WITH BILATERAL SALPINGO OOPHORECTOMY;  Surgeon: Bobbye Charleston, MD;  Location: [facility];  Service: Gynecology;  Laterality: Bilateral;  . Cystoscopy N/A 08/17/2015    Procedure: CYSTOSCOPY;  Surgeon: Bobbye Charleston, MD;  Location: [facility];  Service: Gynecology;  Laterality: N/A;  . Appendectomy  08/17/2015    Procedure: ROBOTIC ASSISTED  APPENDECTOMY;  Surgeon: Bobbye Charleston, MD;  Location: [facility];  Service: Gynecology;;   Current Outpatient Prescriptions on File Prior to Visit  Medication Sig Dispense Refill  . ALPRAZolam (XANAX) 0.25 MG tablet Take 1 tablet (0.25 mg total) by mouth 2 (two) times daily as needed for anxiety. 40 tablet 0  . fexofenadine (ALLEGRA) 60 MG tablet Take 60 mg by mouth daily.    Marland Kitchen ibuprofen (ADVIL,MOTRIN) 200 MG tablet Take 200 mg by mouth every 6 (six) hours as needed for headache.     No current  facility-administered medications on file prior to visit.   Allergies  Allergen Reactions  . Codeine Nausea Only   Family History  Problem Relation Age of Onset  . Arthritis Father   . Cancer Maternal Grandmother   . Stroke Paternal Grandfather    Social History   Social History  . Marital Status: Married    Spouse Name: N/A  . Number of Children: N/A  . Years of Education: N/A   Social History Main Topics  . Smoking status: Former Research scientist (life sciences)  . Smokeless tobacco: Never Used  . Alcohol Use: 0.0 oz/week    0 Standard drinks or equivalent per week  . Drug Use: No  . Sexual Activity: Yes   Other Topics Concern  . None   Social History Narrative    Review of Systems  Constitutional: Positive for diaphoresis. Negative for fever, chills, activity change and appetite change.  HENT: Positive for trouble swallowing. Negative for facial swelling, sore throat and voice change.   Eyes: Negative for visual disturbance.  Respiratory: Negative for cough and shortness of breath.   Cardiovascular: Negative for chest pain, palpitations and leg swelling.  Genitourinary: Negative for decreased urine volume.  Neurological: Positive for numbness. Negative for syncope, facial asymmetry, weakness and headaches.  Hematological: Does not bruise/bleed easily.  Psychiatric/Behavioral: Positive for sleep disturbance.       Objective:  BP 130/84 mmHg  Pulse 84  Temp(Src)  98.1 F (36.7 C)  Resp 16  Ht 5\' 6"  (1.676 m)  Wt 152 lb (68.947 kg)  BMI 24.55 kg/m2  LMP 08/13/2012  Physical Exam  Constitutional: She is oriented to person, place, and time. She appears well-developed and well-nourished. No distress.  HENT:  Head: Normocephalic and atraumatic.  Right Ear: External ear normal.  Left Ear: External ear normal.  Eyes: Conjunctivae are normal. No scleral icterus.  Neck: Normal range of motion. Neck supple. No thyromegaly present.  Cardiovascular: Normal rate, regular rhythm, normal  heart sounds and intact distal pulses.   Pulmonary/Chest: Effort normal and breath sounds normal. No respiratory distress.  Musculoskeletal: She exhibits no edema.  Lymphadenopathy:    She has no cervical adenopathy.  Neurological: She is alert and oriented to person, place, and time.  Skin: Skin is warm and dry. She is not diaphoretic. No erythema.  Psychiatric: She has a normal mood and affect. Her behavior is normal.     [facility] MRI c-spine reviewed and copy scanned in: Shows 1.6 cm T2 hyperintense left thyroid lesion. rec thyroid US for further eval and NM thyroid uptake scan if hyperthyroid. Also notes mild cervical spondylosis and DDD causing mild impingement at C6-7 and borderline impingement at C3-4 and C5-6. Assessment & Plan:   1. Thyroid lesion   Pt is quite anxious about this so will proceed with endocrine referral as well as w/u - does have a second degree relative w/ thyroid cancer but reassured that I think she is at low risk of thyroid carcinoma but will get [place] and likely biopsy   Orders Placed This Encounter  Procedures  . US Soft Tissue Head/Neck    150 LB/NO NEEDS/INS/[facility]HC/RLC/PT W/EPIC ORDER    Standing Status: Future     Number of Occurrences: 1     Standing Expiration Date: 02/25/2017    Order Specific Question:  Reason for Exam (SYMPTOM  OR DIAGNOSIS REQUIRED)    Answer:  eval t    Order Specific Question:  Preferred imaging location?    Answer:  [facility]  . Thyroid Panel With TSH  . CBC  . Comprehensive metabolic panel  . Ambulatory referral to Endocrinology    Referral Priority:  Routine    Referral Type:  Consultation    Referral Reason:  Specialty Services Required    Number of Visits Requested:  1    Meds ordered this encounter  Medications  . sertraline (ZOLOFT) 25 MG tablet    Sig: Take 25 mg by mouth daily.    Delman Cheadle, MD MPH

## 2016-01-05 ENCOUNTER — Ambulatory Visit
Admission: RE | Admit: 2016-01-05 | Discharge: 2016-01-05 | Disposition: A | Discharge status: Home / Self Care | Payer: 59 | Source: Ambulatory Visit | Attending: Family Medicine | Admitting: Family Medicine

## 2016-01-05 DIAGNOSIS — E0789 Other specified disorders of thyroid: Secondary | ICD-10-CM

## 2016-01-05 DIAGNOSIS — E079 Disorder of thyroid, unspecified: Secondary | ICD-10-CM

## 2016-01-08 ENCOUNTER — Other Ambulatory Visit: Payer: Self-pay | Admitting: Family Medicine

## 2016-01-08 DIAGNOSIS — E041 Nontoxic single thyroid nodule: Secondary | ICD-10-CM

## 2016-01-17 ENCOUNTER — Ambulatory Visit
Admission: RE | Admit: 2016-01-17 | Discharge: 2016-01-17 | Disposition: A | Discharge status: Home / Self Care | Payer: 59 | Source: Ambulatory Visit | Attending: Family Medicine | Admitting: Family Medicine

## 2016-01-17 ENCOUNTER — Other Ambulatory Visit (HOSPITAL_COMMUNITY)
Admission: RE | Admit: 2016-01-17 | Discharge: 2016-01-17 | Disposition: A | Discharge status: Home / Self Care | Payer: 59 | Source: Ambulatory Visit | Attending: Family Medicine | Admitting: Family Medicine

## 2016-01-17 DIAGNOSIS — E041 Nontoxic single thyroid nodule: Secondary | ICD-10-CM

## 2016-01-20 ENCOUNTER — Ambulatory Visit: Payer: 59 | Admitting: Endocrinology

## 2016-02-19 ENCOUNTER — Ambulatory Visit (INDEPENDENT_AMBULATORY_CARE_PROVIDER_SITE_OTHER): Payer: 59 | Admitting: Family Medicine

## 2016-02-19 VITALS — BP 110/68 | HR 63 | Temp 97.9°F | Resp 16 | Ht 66.0 in | Wt 163.8 lb

## 2016-02-19 DIAGNOSIS — M2550 Pain in unspecified joint: Secondary | ICD-10-CM | POA: Diagnosis not present

## 2016-02-19 DIAGNOSIS — R635 Abnormal weight gain: Secondary | ICD-10-CM

## 2016-02-19 DIAGNOSIS — N958 Other specified menopausal and perimenopausal disorders: Secondary | ICD-10-CM | POA: Diagnosis not present

## 2016-02-19 DIAGNOSIS — E559 Vitamin D deficiency, unspecified: Secondary | ICD-10-CM

## 2016-02-19 DIAGNOSIS — E8940 Asymptomatic postprocedural ovarian failure: Secondary | ICD-10-CM

## 2016-02-19 DIAGNOSIS — Z1239 Encounter for other screening for malignant neoplasm of breast: Secondary | ICD-10-CM

## 2016-02-19 LAB — COMPREHENSIVE METABOLIC PANEL
ALBUMIN: 4.4 g/dL (ref 3.6–5.1)
ALT: 24 U/L (ref 6–29)
AST: 20 U/L (ref 10–35)
Alkaline Phosphatase: 93 U/L (ref 33–115)
BUN: 13 mg/dL (ref 7–25)
CHLORIDE: 102 mmol/L (ref 98–110)
CO2: 27 mmol/L (ref 20–31)
CREATININE: 0.74 mg/dL (ref 0.50–1.10)
Calcium: 9.6 mg/dL (ref 8.6–10.2)
Glucose, Bld: 86 mg/dL (ref 65–99)
POTASSIUM: 4.1 mmol/L (ref 3.5–5.3)
SODIUM: 139 mmol/L (ref 135–146)
Total Bilirubin: 0.4 mg/dL (ref 0.2–1.2)
Total Protein: 7.4 g/dL (ref 6.1–8.1)

## 2016-02-19 LAB — CBC
HEMATOCRIT: 45.3 % (ref 36.0–46.0)
Hemoglobin: 15.3 g/dL — ABNORMAL HIGH (ref 12.0–15.0)
MCH: 29.4 pg (ref 26.0–34.0)
MCHC: 33.8 g/dL (ref 30.0–36.0)
MCV: 86.9 fL (ref 78.0–100.0)
MPV: 10 fL (ref 8.6–12.4)
PLATELETS: 308 10*3/uL (ref 150–400)
RBC: 5.21 MIL/uL — AB (ref 3.87–5.11)
RDW: 13.3 % (ref 11.5–15.5)
WBC: 4.7 10*3/uL (ref 4.0–10.5)

## 2016-02-19 LAB — THYROID PANEL WITH TSH
Free Thyroxine Index: 2 (ref 1.4–3.8)
T3 UPTAKE: 28 % (ref 22–35)
T4 TOTAL: 7.3 ug/dL (ref 4.5–12.0)
TSH: 1.8 mIU/L

## 2016-02-19 LAB — FSH/LH
FSH: 122.2 m[IU]/mL — AB
LH: 58.1 m[IU]/mL

## 2016-02-19 LAB — VITAMIN B12: VITAMIN B 12: 362 pg/mL (ref 200–1100)

## 2016-02-19 LAB — C-REACTIVE PROTEIN

## 2016-02-19 LAB — SEDIMENTATION RATE: Sed Rate: 12 mm/hr (ref 0–20)

## 2016-02-19 MED ORDER — ESTRADIOL 14 MCG/24HR TD PTWK: 1.0000 | MEDICATED_PATCH | TRANSDERMAL | Status: DC

## 2016-02-19 MED ORDER — MELOXICAM 15 MG PO TABS: 15.0000 mg | ORAL_TABLET | Freq: Every day | ORAL | Status: DC

## 2016-02-19 MED ORDER — ESTRADIOL 0.025 MG/24HR TD PTTW: 1.0000 | MEDICATED_PATCH | TRANSDERMAL | Status: DC

## 2016-02-19 MED ORDER — ALPRAZOLAM 0.25 MG PO TABS: 0.2500 mg | ORAL_TABLET | Freq: Two times a day (BID) | ORAL | Status: DC | PRN

## 2016-02-19 MED ORDER — CYCLOBENZAPRINE HCL ER 30 MG PO CP24: 30.0000 mg | ORAL_CAPSULE | Freq: Every day | ORAL | Status: DC

## 2016-02-19 NOTE — Progress Notes (Signed)
Subjective:    Patient ID: Danielle Davidson, female    DOB: Jun 21, 1966, 50 y.o.   MRN: [id number] By signing my name below, I, Danielle Davidson, attest that this documentation has been prepared under the direction and in the presence of Delman Cheadle, MD. Electronically Signed: Judithe Davidson, ER Scribe. 02/19/2016. 10:37 AM.  Chief Complaint  Patient presents with  . other    joint pain/ neck,shoulder, knee x 2 wk  . other    pt has gain 30lbs in 3 mons, unexplained weight gain  . Medication Refill    HPI HPI Comments: Danielle Davidson is a 50 y.o. female who presents to [facility] complaining of worsening joint pain in her feet, wrists, knees, and neck since her surgery six months ago. She has severe pain in her feet when she gets up after not move for a while. She states her neck pain feels muscular, and the pain has gradually spread to her shoulders over the last couple of months. She has a family hx of arthritis (father). She was initially recommended physical therapy for her neck pain, but that never happened due to the workup for the thyroid nodule which was found during a c-spine MRI.  She has also gained 30 lbs in the last four months, and she is wondering if that is related to taking zoloft, or her hysterectomy six months ago, or quitting smoking. She states before her surgery she felt great and now she feels like she is 50 years old. She has been taking ibuprofen and tylenol intermittently which does not seem to help her pain. She is also complaining of multiple hot flashes per day. No family hx of breast cancer. She is hesitant to go on hormone replacement therapy due to the cancer risk. She was on an antiestrogen therapy for two years leading up to her surgery. She is also complaining of increased bruising since her surgery.  She was found to have a thyroid nodule which was found during a c-spine MRI which was obtained to evaluate bilateral arm numbness. She had a hysterectomy with  bilateral oophorectomy, six months prior on 09/28. Since that time she noted weight gain and hot flashes, as well as numbness in her left leg and a continual radicular pain in her left arm. Thyroid panel was normal. Thyroid US confirmed nodule and biopsy showed benign follicular nodule.   Her gynecologist is Dr. [name]. Her gynecologist instructed her after her surgery to call if she wanted to start on estrogen therapy.  Past Medical History  Diagnosis Date  . Allergy   . Arthritis    Allergies  Allergen Reactions  . Codeine Nausea Only   Current Outpatient Prescriptions on File Prior to Visit  Medication Sig Dispense Refill  . ALPRAZolam (XANAX) 0.25 MG tablet Take 1 tablet (0.25 mg total) by mouth 2 (two) times daily as needed for anxiety. 40 tablet 0  . fexofenadine (ALLEGRA) 60 MG tablet Take 60 mg by mouth daily.    Marland Kitchen ibuprofen (ADVIL,MOTRIN) 200 MG tablet Take 200 mg by mouth every 6 (six) hours as needed for headache.    . sertraline (ZOLOFT) 25 MG tablet Take 25 mg by mouth daily.     No current facility-administered medications on file prior to visit.    Review of Systems  Constitutional: Positive for diaphoresis, activity change, appetite change, fatigue and unexpected weight change. Negative for fever and chills.  Cardiovascular: Negative for chest pain and leg swelling.  Gastrointestinal: Negative for nausea  and vomiting.  Genitourinary: Negative for vaginal bleeding and menstrual problem.  Musculoskeletal: Positive for myalgias, back pain, joint swelling, arthralgias, gait problem, neck pain and neck stiffness.  Skin: Negative for rash.  Allergic/Immunologic: Negative for immunocompromised state.  Neurological: Positive for numbness and headaches. Negative for weakness.  Hematological: Negative for adenopathy. Bruises/bleeds easily.  Psychiatric/Behavioral: Positive for sleep disturbance.      Objective:  BP 110/68 mmHg  Pulse 63  Temp(Src) 97.9 F (36.6 C)  (Oral)  Resp 16  Ht 5\' 6"  (1.676 m)  Wt 163 lb 12.8 oz (74.299 kg)  BMI 26.45 kg/m2  SpO2 99%  LMP 08/13/2012  Physical Exam  Constitutional: She is oriented to person, place, and time. She appears well-developed and well-nourished. No distress.  HENT:  Head: Normocephalic and atraumatic.  Eyes: Pupils are equal, round, and reactive to light.  Neck: Neck supple.  Cardiovascular: Normal rate.   Pulmonary/Chest: Effort normal. No respiratory distress.  Musculoskeletal: Normal range of motion.  Muscle tightness over trapezius but no spinous process tenderness. No rhomboid tenderness, no pain over sternoclavicular areas.   Neurological: She is alert and oriented to person, place, and time. Coordination normal.  Skin: Skin is warm and dry. She is not diaphoretic.  Psychiatric: She has a normal mood and affect. Her behavior is normal.  Nursing note and vitals reviewed.     Assessment & Plan:   1. Surgical menopause   2. Arthralgia   3. Weight gain   4. Screening for breast cancer   5. Vitamin D deficiency   Pt c/o mult generalized systemic complaints such as arthralgias/myalgias, 30 lb weight gain, fatigue, hot flashes, easy bruising all progressively worsening since her hysterectomy 6 mos prior.  In that time, we have also started her on zoloft in that time.  Pt is suspicious that the zoloft may be the etiology rather than the surgical menopause since she was on anti-estrogen medications for sev yrs prior.   Pt is on a very low dose of zoloft and it does seem to be helping her mood so I am reluctant to have her stop this esp as she did have side effects when starting so may have worsening w/d sxs.  Check hormonal labs to confirm, but I suspect her sxs are due to her recent total hysterectomy with BSO throwing into a more profound surgical menopause.  Encouraged pt to consider trial of HRT but pt is concerned about increased breast cancer risk so is reluctant.  Pt has been keeping up on her  mammograms and does not have a sig FHx of breast cancer.  Rec pt do trial of HRT for several months as if her sxs resolved then she is aware of etiology and can make a decision on whether it is worth it to cont. W/o trying HRT, I suspect pt will continue to be concerned that there is some other etiology and may want to stop zoloft even though she is doing quite well and stopped smoking and feel the need to cont w/ a large w/u.  Also advised pt that the sooner after surgery that she tries HRT, the lower the risk of complications.  Rec very low dose patches which have fewer vascular complications than oral but pt thinks a patch will not stay on as she had so much trouble with the nicotine patches with this so ok to switch preparations to oral if pt decides to try.  Encouraged pt to speak with her gynecologist about this as well.  Refilled prn xanax which pt is using very rarely since she quite smoking sev mos prior.  Try mobic qam and alprazolam qhs for cervical radiculopathy.  Orders Placed This Encounter  Procedures  . CBC  . Sedimentation Rate  . C-reactive protein  . Thyroid Panel With TSH  . Comprehensive metabolic panel  . Estrogens, Total  . DHEA-sulfate  . FSH/LH  . Vitamin B12  . VITAMIN D 25 Hydroxy (Vit-D Deficiency, Fractures)  . Testos,Total,Free and SHBG (Female)    Meds ordered this encounter  Medications  . meloxicam (MOBIC) 15 MG tablet    Sig: Take 1 tablet (15 mg total) by mouth daily.    Dispense:  30 tablet    Refill:  0  . cyclobenzaprine (AMRIX) 30 MG 24 hr capsule    Sig: Take 1 capsule (30 mg total) by mouth at bedtime.    Dispense:  30 capsule    Refill:  0  . ALPRAZolam (XANAX) 0.25 MG tablet    Sig: Take 1 tablet (0.25 mg total) by mouth 2 (two) times daily as needed for anxiety.    Dispense:  40 tablet    Refill:  0  . estradiol (MENOSTAR) 14 MCG/24HR    Sig: Place 1 patch onto the skin once a week. Apply the patch to a clean, dry, non-oily skin area of  your lower abdomen, hips below the waist, or buttocks that has little or no hair and is free of cuts or irritation.    Dispense:  4 patch    Refill:  1  . estradiol (VIVELLE-DOT) 0.025 MG/24HR    Sig: Place 1 patch onto the skin 2 (two) times a week.    Dispense:  8 patch    Refill:  1  . Vitamin D, Ergocalciferol, (DRISDOL) 50000 units CAPS capsule    Sig: Take 1 capsule (50,000 Units total) by mouth every 7 (seven) days.    Dispense:  24 capsule    Refill:  0    I personally performed the services described in this documentation, which was scribed in my presence. The recorded information has been reviewed and considered, and addended by me as needed.  Delman Cheadle, MD MPH

## 2016-02-19 NOTE — Patient Instructions (Addendum)
IF you received an x-ray today, you will receive an invoice from [facility]. Please contact [facility] at [phone] with questions or concerns regarding your invoice.   IF you received labwork today, you will receive an invoice from [employer]. Please contact [facility] at [phone] with questions or concerns regarding your invoice.   Our billing staff will not be able to assist you with questions regarding bills from these companies.  You will be contacted with the lab results as soon as they are available. The fastest way to get your results is to activate your My Chart account. Instructions are located on the last page of this paperwork. If you have not heard from [place] regarding the results in 2 weeks, please contact this office.    Hormone Therapy At menopause, your body begins making less estrogen and progesterone hormones. This causes the body to stop having menstrual periods. This is because estrogen and progesterone hormones control your periods and menstrual cycle. A lack of estrogen may cause symptoms such as:  Hot flushes (or hot flashes).  Vaginal dryness.  Dry skin.  Loss of sex drive.  Risk of bone loss (osteoporosis). When this happens, you may choose to take hormone therapy to get back the estrogen lost during menopause. When the hormone estrogen is given alone, it is usually referred to as ET (Estrogen Therapy). When the hormone progestin is combined with estrogen, it is generally called HT (Hormone Therapy). This was formerly known as hormone replacement therapy (HRT). Your caregiver can help you make a decision on what will be best for you. The decision to use HT seems to change often as new studies are done. Many studies do not agree on the benefits of hormone replacement therapy. LIKELY BENEFITS OF HT INCLUDE PROTECTION FROM:  Hot Flushes (also called hot flashes) - A hot flush is a sudden feeling of heat that  spreads over the face and body. The skin may redden like a blush. It is connected with sweats and sleep disturbance. Women going through menopause may have hot flushes a few times a month or several times per day depending on the woman.  Osteoporosis (bone loss) - Estrogen helps guard against bone loss. After menopause, a woman's bones slowly lose calcium and become weak and brittle. As a result, bones are more likely to break. The hip, wrist, and spine are affected most often. Hormone therapy can help slow bone loss after menopause. Weight bearing exercise and taking calcium with vitamin D also can help prevent bone loss. There are also medications that your caregiver can prescribe that can help prevent osteoporosis.  Vaginal dryness - Loss of estrogen causes changes in the vagina. Its lining may become thin and dry. These changes can cause pain and bleeding during sexual intercourse. Dryness can also lead to infections. This can cause burning and itching. (Vaginal estrogen treatment can help relieve pain, itching, and dryness.)  Urinary tract infections are more common after menopause because of lack of estrogen. Some women also develop urinary incontinence because of low estrogen levels in the vagina and bladder.  Possible other benefits of estrogen include a positive effect on mood and short-term memory in women. RISKS AND COMPLICATIONS  Using estrogen alone without progesterone causes the lining of the uterus to grow. This increases the risk of lining of the uterus (endometrial) cancer. Your caregiver should give another hormone called progestin if you have a uterus.  Women who take combined (estrogen and progestin) HT  appear to have an increased risk of breast cancer. The risk appears to be small, but increases throughout the time that HT is taken.  Combined therapy also makes the breast tissue slightly denser which makes it harder to read mammograms (breast X-rays).  Combined, estrogen and  progesterone therapy can be taken together every day, in which case there may be spotting of blood. HT therapy can be taken cyclically in which case you will have menstrual periods. Cyclically means HT is taken for a set amount of days, then not taken, then this process is repeated.  HT may increase the risk of stroke, heart attack, breast cancer and forming blood clots in your leg.  Transdermal estrogen (estrogen that is absorbed through the skin with a patch or a cream) may have better results with:  Cholesterol.  Blood pressure.  Blood clots. Having the following conditions may indicate you should not have HT:  Endometrial cancer.  Liver disease.  Breast cancer.  Heart disease.  History of blood clots.  Stroke. TREATMENT   If you choose to take HT and have a uterus, usually estrogen and progestin are prescribed.  Your caregiver will help you decide the best way to take the medications.  Possible ways to take estrogen include:  Pills.  Patches.  Gels.  Sprays.  Vaginal estrogen cream, rings and tablets.  It is best to take the lowest dose possible that will help your symptoms and take them for the shortest period of time that you can.  Hormone therapy can help relieve some of the problems (symptoms) that affect women at menopause. Before making a decision about HT, talk to your caregiver about what is best for you. Be well informed and comfortable with your decisions. HOME CARE INSTRUCTIONS   Follow your caregivers advice when taking the medications.  A Pap test is done to screen for cervical cancer.  The first Pap test should be done at age 54.  Between ages 83 and 51, Pap tests are repeated every 2 years.  Beginning at age 44, you are advised to have a Pap test every 3 years as long as the past 3 Pap tests have been normal.  Some women have medical problems that increase the chance of getting cervical cancer. Talk to your caregiver about these problems. It  is especially important to talk to your caregiver if a new problem develops soon after your last Pap test. In these cases, your caregiver may recommend more frequent screening and Pap tests.  The above recommendations are the same for women who have or have not gotten the vaccine for HPV (human papillomavirus).  If you had a hysterectomy for a problem that was not a cancer or a condition that could lead to cancer, then you no longer need Pap tests. However, even if you no longer need a Pap test, a regular exam is a good idea to make sure no other problems are starting.  If you are between ages 48 and 23, and you have had normal Pap tests going back 10 years, you no longer need Pap tests. However, even if you no longer need a Pap test, a regular exam is a good idea to make sure no other problems are starting.  If you have had past treatment for cervical cancer or a condition that could lead to cancer, you need Pap tests and screening for cancer for at least 20 years after your treatment.  If Pap tests have been discontinued, risk factors (such as a  new sexual partner)need to be re-assessed to determine if screening should be resumed.  Some women may need screenings more often if they are at high risk for cervical cancer.  Get mammograms done as per the advice of your caregiver. SEEK IMMEDIATE MEDICAL CARE IF:  You develop abnormal vaginal bleeding.  You have pain or swelling in your legs, shortness of breath, or chest pain.  You develop dizziness or headaches.  You have lumps or changes in your breasts or armpits.  You have slurred speech.  You develop weakness or numbness of your arms or legs.  You have pain, burning, or bleeding when urinating.  You develop abdominal pain.   This information is not intended to replace advice given to you by your health care provider. Make sure you discuss any questions you have with your health care provider.   Document Released: 08/04/2003  Document Revised: 03/22/2015 Document Reviewed: 05/09/2015 Elsevier Interactive Patient Education [employer].

## 2016-02-20 ENCOUNTER — Other Ambulatory Visit: Payer: Self-pay | Admitting: Family Medicine

## 2016-02-20 DIAGNOSIS — Z1231 Encounter for screening mammogram for malignant neoplasm of breast: Secondary | ICD-10-CM

## 2016-02-20 LAB — VITAMIN D 25 HYDROXY (VIT D DEFICIENCY, FRACTURES): VIT D 25 HYDROXY: 12 ng/mL — AB (ref 30–100)

## 2016-02-20 LAB — DHEA-SULFATE: DHEA SO4: 102 ug/dL (ref 19–231)

## 2016-02-22 LAB — ESTROGENS, TOTAL: Estrogen: 111.9 pg/mL

## 2016-02-23 LAB — TESTOS,TOTAL,FREE AND SHBG (FEMALE)
SEX HORMONE BINDING GLOB.: 93 nmol/L (ref 17–124)
Testosterone, Free: 3.4 pg/mL (ref 0.1–6.4)
Testosterone,Total,LC/MS/MS: 41 ng/dL (ref 2–45)

## 2016-02-24 DIAGNOSIS — E559 Vitamin D deficiency, unspecified: Secondary | ICD-10-CM | POA: Insufficient documentation

## 2016-02-24 DIAGNOSIS — E8940 Asymptomatic postprocedural ovarian failure: Secondary | ICD-10-CM | POA: Insufficient documentation

## 2016-02-24 MED ORDER — VITAMIN D (ERGOCALCIFEROL) 1.25 MG (50000 UNIT) PO CAPS: 50000.0000 [IU] | ORAL_CAPSULE | ORAL | Status: DC

## 2016-03-09 ENCOUNTER — Ambulatory Visit
Admission: RE | Admit: 2016-03-09 | Discharge: 2016-03-09 | Disposition: A | Discharge status: Home / Self Care | Payer: 59 | Source: Ambulatory Visit | Attending: Family Medicine | Admitting: Family Medicine

## 2016-03-09 DIAGNOSIS — Z1231 Encounter for screening mammogram for malignant neoplasm of breast: Secondary | ICD-10-CM

## 2016-05-20 ENCOUNTER — Other Ambulatory Visit: Payer: Self-pay | Admitting: Family Medicine

## 2016-05-23 NOTE — Telephone Encounter (Signed)
Faxed and asked pharm to advise pt when ready.

## 2016-05-23 NOTE — Telephone Encounter (Signed)
Please call or fax in rx (signed in the from providers box in the front) and let pt know

## 2016-06-04 ENCOUNTER — Ambulatory Visit (INDEPENDENT_AMBULATORY_CARE_PROVIDER_SITE_OTHER): Payer: 59 | Admitting: Family Medicine

## 2016-06-04 VITALS — BP 118/76 | HR 78 | Temp 98.3°F | Resp 18 | Ht 66.0 in | Wt 165.0 lb

## 2016-06-04 DIAGNOSIS — E559 Vitamin D deficiency, unspecified: Secondary | ICD-10-CM

## 2016-06-04 DIAGNOSIS — M2550 Pain in unspecified joint: Secondary | ICD-10-CM

## 2016-06-04 MED ORDER — ALPRAZOLAM 0.25 MG PO TABS: 0.2500 mg | ORAL_TABLET | Freq: Two times a day (BID) | ORAL | Status: DC | PRN

## 2016-06-04 NOTE — Progress Notes (Signed)
Danielle Davidson  MRN: [id number] DOB: October 12, 1966  Subjective:  Danielle Davidson is a 50 y.o. female seen in office today for a chief complaint of joint pain x 6 months.Pt noticed the joint pain the same week she started taking zoloft for anxiety. Pain started in the neck and has progressed to all joints, excluding the TMJ. The joint pain is worse after patient has been stiff for a while. Gets better with movement throughout the day however is aggravated if she has to lift or carry any objects. Pt was seen in April 2017 for joint pain and had an extensive lab workup, most of which was unremarkable except for low Vit D level of 12. Pt was informed to start taking OTC vit D supplement.   Pt has tried vitamin D, estrogen, mobic, and flexeril with no relief.   Pt decided 3 weeks ago that she was going to stop taking zoloft to see if this decreased her joint pain. Her sister, who is a therapist, recommended she taper by skipping a pill every other day. Did not take zoloft today. Pt aware that she is more irritable and anxious since tapering off the zoloft. Has used half a pill of prescribed xanax intermittently while tapering zoloft for anxiety with success. Joint pain has not gotten any better over tapering course.   Of note, patient questions if she can be tested for lyme. Was tested 15 years ago and it was negative. [place] many instances in the past where she picked a tick off of her. In 2015 pt did note a rash on left anterior abdomen. Was not evaluated by  a doctor because it was not bothersome to pt. [place] disappeared after two weeks without any treatment. Pt does not recall having any other symptoms around this time, such as fever, fatigue, chills, vomiting, or myalgias.   Review of Systems  Musculoskeletal: Negative for myalgias and gait problem.  Skin: Negative for rash.  Neurological: Negative for dizziness, light-headedness and headaches.    Psychiatric/Behavioral: Positive for sleep disturbance (every time she moves she hurts and it wakes it her up) and agitation.       Difficulty coping, irritable      Patient Active Problem List   Diagnosis Date Noted  . Vitamin D deficiency 02/24/2016  . 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 Nicotine dependence 03/22/2014    Current Outpatient Prescriptions on File Prior to Visit  Medication Sig Dispense Refill  . fexofenadine (ALLEGRA) 60 MG tablet Take 60 mg by mouth daily. Reported on 06/04/2016     No current facility-administered medications on file prior to visit.    Allergies  Allergen Reactions  . Codeine Nausea Only    Objective:  BP 118/76 mmHg  Pulse 78  Temp(Src) 98.3 F (36.8 C) (Oral)  Resp 18  Ht 5\' 6"  (1.676 m)  Wt 165 lb (74.844 kg)  BMI 26.64 kg/m2  SpO2 99%  LMP 08/13/2012  Physical Exam  Constitutional: She is oriented to person, place, and time.  Well developed, well nourished  HENT:  Head: Normocephalic and atraumatic.  Eyes: Conjunctivae are normal.  Neck: Normal range of motion.  Pulmonary/Chest: Effort normal.  Musculoskeletal:       Left shoulder: She exhibits decreased range of motion and tenderness (along palpation of joint). She exhibits no swelling and normal strength.       Right hip: She exhibits decreased range of motion and tenderness (along palpation of joint).  Generalized  discomfort when palpating joints of upper and lower extremity joints bilaterally. Tenderness most prominent in left shoulder and right hip joint.  5/5 strength in upper and lower extremities bilaterally.    Neurological: She is alert and oriented to person, place, and time. Gait normal.  Skin: Skin is warm and dry.  Psychiatric:  Tearful when talking about her joint pain   Vitals reviewed.   Assessment and Plan :  1. Arthralgia - B. Burgdorfi Antibodies - C-reactive protein - Sedimentation Rate - Vitamin B12 - ANA, IFA Comprehensive Panel - Rheumatoid  factor  2. Vitamin D deficiency - Vitamin D, 25-hydroxy  Discontinue zoloft. Take alprazolam for the next two weeks to help with anxiety. Can use Tylenol PM before bedtime to help with sleep.  If no improvement, will follow up with Dr. Brigitte Pulse on 07/05/16 (will consider starting Cymbalta or Effexor at this time for anxiety)  Tenna Delaine PA-C  [facility] 06/04/2016 10:17 PM

## 2016-06-04 NOTE — Patient Instructions (Addendum)
  Stop the Zoloft entirely.  Take Xanax for the next two weeks to help with anxiety. Will contact you with results once they return. If no improvement follow up with Dr. Brigitte Pulse on 07/05/16 Try and get as much rest at night to help with pain tolerance. Can use tylenol PM to help with sleep.  Thank you for letting me participate in your health and well being.    IF you received an x-ray today, you will receive an invoice from [facility]. Please contact [facility] at [phone] with questions or concerns regarding your invoice.   IF you received labwork today, you will receive an invoice from [employer]. Please contact [facility] at [phone] with questions or concerns regarding your invoice.   Our billing staff will not be able to assist you with questions regarding bills from these companies.  You will be contacted with the lab results as soon as they are available. The fastest way to get your results is to activate your My Chart account. Instructions are located on the last page of this paperwork. If you have not heard from [place] regarding the results in 2 weeks, please contact this office.

## 2016-06-05 LAB — RHEUMATOID FACTOR

## 2016-06-05 LAB — C-REACTIVE PROTEIN

## 2016-06-05 LAB — VITAMIN B12: Vitamin B-12: 353 pg/mL (ref 200–1100)

## 2016-06-05 NOTE — Progress Notes (Signed)
Patient ID: Danielle Davidson, female   DOB: 17-Jul-1966, 50 y.o.   MRN: [id number]  Pt assessed independently by myself, reviewed documentation and agree w/ assessment and plan.  1. Arthralgia   2. Vitamin D deficiency   3.      Mood d/o - mixed depression and anxiety with some insomnia - suspect the zoloft might be causing arthralgias due to onset soon after starting - stop today and would expect sxs to resolve in 2 wks.  Consider trial of Cymbalta in future but I do think it will be helpful to have pt have a wash out period off of all anti-depressants for more accurate assessment of internal sxs vs med side effects. Cons rheum eval if sxs persist.  Orders Placed This Encounter  Procedures  . B. Burgdorfi Antibodies  . C-reactive protein  . Sedimentation Rate  . Vitamin B12  . Vitamin D, 25-hydroxy  . ANA, IFA Comprehensive Panel  . Rheumatoid factor    Meds ordered this encounter  Medications  . ALPRAZolam (XANAX) 0.25 MG tablet    Sig: Take 1 tablet (0.25 mg total) by mouth 2 (two) times daily as needed for anxiety.    Dispense:  60 tablet    Refill:  1  . sertraline (ZOLOFT) 25 MG tablet    Sig: Take 1 tablet by mouth daily. Reported on 06/04/2016    Delman Cheadle, M.D.  [facility] [address] [place], [place] [address] [phone] phone [phone] fax  06/05/2016 12:55 AM

## 2016-06-06 LAB — ANA, IFA COMPREHENSIVE PANEL
Anti Nuclear Antibody(ANA): NEGATIVE
ENA SM AB SER-ACNC: NEGATIVE
SM/RNP: <1
SSA (RO) (ENA) ANTIBODY, IGG: NEGATIVE
SSB (LA) (ENA) ANTIBODY, IGG: NEGATIVE
Scleroderma (Scl-70) (ENA) Antibody, IgG: <1
ds DNA Ab: 1 IU/mL

## 2016-06-06 LAB — VITAMIN D 25 HYDROXY (VIT D DEFICIENCY, FRACTURES): Vit D, 25-Hydroxy: 42 ng/mL (ref 30–100)

## 2016-06-06 LAB — LYME AB/WESTERN BLOT REFLEX

## 2016-06-06 LAB — SEDIMENTATION RATE: SED RATE: 5 mm/h (ref 0–20)

## 2016-07-05 ENCOUNTER — Ambulatory Visit (INDEPENDENT_AMBULATORY_CARE_PROVIDER_SITE_OTHER): Payer: 59 | Admitting: Family Medicine

## 2016-07-05 VITALS — BP 110/78 | HR 70 | Temp 98.0°F | Resp 18 | Ht 66.0 in | Wt 166.2 lb

## 2016-07-05 DIAGNOSIS — M2550 Pain in unspecified joint: Secondary | ICD-10-CM | POA: Diagnosis not present

## 2016-07-05 DIAGNOSIS — L309 Dermatitis, unspecified: Secondary | ICD-10-CM | POA: Diagnosis not present

## 2016-07-05 DIAGNOSIS — R635 Abnormal weight gain: Secondary | ICD-10-CM | POA: Diagnosis not present

## 2016-07-05 DIAGNOSIS — F4323 Adjustment disorder with mixed anxiety and depressed mood: Secondary | ICD-10-CM

## 2016-07-05 DIAGNOSIS — R5383 Other fatigue: Secondary | ICD-10-CM

## 2016-07-05 DIAGNOSIS — E639 Nutritional deficiency, unspecified: Secondary | ICD-10-CM | POA: Diagnosis not present

## 2016-07-05 MED ORDER — SERTRALINE HCL 25 MG PO TABS: 25.0000 mg | ORAL_TABLET | Freq: Every day | ORAL | 65 refills | Status: DC

## 2016-07-05 MED ORDER — CYANOCOBALAMIN 1000 MCG/ML IJ SOLN
1000.0000 ug | Freq: Once | INTRAMUSCULAR | Status: AC
  Administered 2016-07-05: 1000 ug via INTRAMUSCULAR
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
## 2016-07-05 NOTE — Patient Instructions (Addendum)
I have referred you to [facility]. Another option would be [facility].   Restart the zoloft. If you are still not sleeping well and hurting at night in another 2 wks, call and we can start some amitryptiline at night - a very mild anti-depressant adjunct that can help with sleep and pain and would be used in addition to the zoloft.   IF you received an x-ray today, you will receive an invoice from [facility]. Please contact [facility] at [phone] with questions or concerns regarding your invoice.   IF you received labwork today, you will receive an invoice from [facility]. Please contact [facility] at [phone] with questions or concerns regarding your invoice.   Our billing staff will not be able to assist you with questions regarding bills from these companies.  You will be contacted with the lab results as soon as they are available. The fastest way to get your results is to activate your My Chart account. Instructions are located on the last page of this paperwork. If you have not heard from [place] regarding the results in 2 weeks, please contact this office.     Vegetarian Eating and Nutrition Vegetarian diets are designed for those people who prefer vegetarian eating for religious, ecologic, or personal reasons. These diets, which are often lower in cholesterol and saturated fats, can provide significant health benefits. They result in lower rates of obesity, diabetes, breast and colon cancers, and cardiovascular and gallbladder diseases.  There are several types of vegetarian diets, but all restrict animal proteins to some degree. Because animal proteins have important nutrients, vegetarians should make sure to get these nutrients from other types of foods. The main purpose of healthy vegetarian eating is to provide the energy, vitamins, and minerals for proper growth and health maintenance at any age.  WHAT DO I NEED  TO KNOW ABOUT VEGETARIAN EATING AND NUTRITION? The following nutrients are found in animal products. It is important to make sure you get enough of these nutrients from your diet. If you think you are not getting the right nutrients or if you do not eat any animal products, talk with your health care provider or dietitian about taking supplements. Protein Your dietitian can help you determine your individual protein needs. Sources of protein include:  Beans, such as black beans or kidney beans, or other legumes, such as lentils and split peas. Soy products. Nuts, such as almonds, [place] nuts, and pecans. Seeds, such as sunflower seeds. Tofu, tempeh, and hummus. Eggs, milk, and cheese. Vitamin B12 This vitamin is only found in:  Cheeses, fish, and eggs. It can also be found in some breakfast cereals and other prepared products that have added vitamin B12. If you eat no animal products, you should discuss taking a supplement with your health care provider or dietitian. Vitamin D Good sources of vitamin D include:  Cod liver oil. Fish, such as swordfish, salmon, and tuna. Dairy products. Orange juice. Fortified mushrooms. Cereals with added vitamin D. Iron Good sources of iron include:  Dark, leafy greens. Nuts. Beans. Grain products that have added iron, such as cereals. Tofu, tempeh, soybeans, and quinoa. Plant-based iron is better absorbed when eaten with vitamin C. For example, squeeze lemon juice over cooked greens like kale, chard, or spinach, or have a glass of orange juice with your meals. Omega-3 Fatty Acids Good sources of omega-3 fatty acids include:  Walnuts. Foods with added omega-3 fatty acids, such as eggs, milk, and  juices. Flax seeds, canola oil, soybean oil, and tofu. Fish (cold water fatty fish such as salmon, sardines, mackerel, herring, lake trout, and albacore tuna). Calcium Good sources of calcium include:  Dark, leafy greens, such as kale, bok choy, Chinese cabbage,  collard greens and mustard greens. Broccoli. Okra. Dairy products. Soy products with added calcium. Calcium-fortified breakfast cereals, calcium-fortified fruit juices, and figs. Zinc Good sources of zinc include:  Legumes. Dairy products. Wheat germ, cereals, and breads that have added zinc. Baked beans. Legumes, such as cashews, chickpeas, kidney beans, and green peas. Almonds and nut butters. Tofu and other soy products.   This information is not intended to replace advice given to you by your health care provider. Make sure you discuss any questions you have with your health care provider.   Document Released: 11/08/2003 Document Revised: 11/26/2014 Document Reviewed: 09/10/2013 Elsevier Interactive Patient Education [employer].

## 2016-10-08 ENCOUNTER — Ambulatory Visit (INDEPENDENT_AMBULATORY_CARE_PROVIDER_SITE_OTHER): Payer: 59 | Admitting: Family Medicine

## 2016-10-08 VITALS — BP 110/66 | HR 67 | Temp 98.2°F | Resp 16 | Ht 65.0 in | Wt 172.0 lb

## 2016-10-08 DIAGNOSIS — J069 Acute upper respiratory infection, unspecified: Secondary | ICD-10-CM | POA: Diagnosis not present

## 2016-10-08 MED ORDER — PSEUDOEPHEDRINE HCL ER 120 MG PO TB12: 120.0000 mg | ORAL_TABLET | Freq: Two times a day (BID) | ORAL | 0 refills | Status: DC

## 2016-10-08 MED ORDER — AZITHROMYCIN 250 MG PO TABS: ORAL_TABLET | ORAL | 0 refills | Status: DC

## 2016-10-08 MED ORDER — GUAIFENESIN ER 1200 MG PO TB12: 1.0000 | ORAL_TABLET | Freq: Two times a day (BID) | ORAL | 1 refills | Status: DC | PRN

## 2016-10-08 MED ORDER — IPRATROPIUM BROMIDE 0.03 % NA SOLN: 2.0000 | Freq: Four times a day (QID) | NASAL | 1 refills | Status: DC

## 2016-10-08 NOTE — Progress Notes (Signed)
Subjective:  By signing my name below, I, Raven Small, attest that this documentation has been prepared under the direction and in the presence of Delman Cheadle, MD.  Electronically Signed: Thea Alken, ED Scribe. 10/08/2016. 2:50 PM.   Patient ID: Danielle Davidson, female    DOB: 28-Oct-1966, 50 y.o.   MRN: [id number]  HPI Chief Complaint  Patient presents with  . Cough    Congestion x 2 days    HPI Comments: Danielle Davidson is a 50 y.o. female who presents to the [facility] complaining of cough onset 3 days ago. Pt states symptoms started with congestion and scratchy throat 4 days ago that progressed to low grade fever and cough. She has tried sudafed with moderate relief. She reports sick contacts at work. She denies CP and SOB. She is schedule to have rtc surgery in a couple weeks.  Patient Active Problem List   Diagnosis Date Noted  . Vitamin D deficiency 02/24/2016  . 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 Nicotine dependence 03/22/2014   Past Medical History:  Diagnosis Date  . Allergy   . Arthritis    Past Surgical History:  Procedure Laterality Date  . APPENDECTOMY  08/17/2015   Procedure: ROBOTIC ASSISTED  APPENDECTOMY;  Surgeon: Bobbye Charleston, MD;  Location: [facility];  Service: Gynecology;;  . Consuela Mimes N/A 08/17/2015   Procedure: CYSTOSCOPY;  Surgeon: Bobbye Charleston, MD;  Location: [facility];  Service: Gynecology;  Laterality: N/A;  . egg donation    . ROBOTIC ASSISTED TOTAL HYSTERECTOMY WITH BILATERAL SALPINGO OOPHERECTOMY Bilateral 08/17/2015   Procedure: ROBOTIC ASSISTED TOTAL HYSTERECTOMY WITH BILATERAL SALPINGO OOPHORECTOMY;  Surgeon: Bobbye Charleston, MD;  Location: [facility];  Service: Gynecology;  Laterality: Bilateral;  . WISDOM TOOTH EXTRACTION     Allergies  Allergen Reactions  . Codeine Nausea Only   Prior to Admission medications   Medication Sig Start Date End Date Taking? Authorizing Provider  ALPRAZolam (XANAX) 0.25 MG tablet Take 1 tablet  (0.25 mg total) by mouth 2 (two) times daily as needed for anxiety. 06/04/16  Yes Shawnee Knapp, MD  fexofenadine (ALLEGRA) 60 MG tablet Take 60 mg by mouth daily. Reported on 06/04/2016   Yes Historical Provider, MD  sertraline (ZOLOFT) 25 MG tablet Take 1 tablet (25 mg total) by mouth daily. Reported on 06/04/2016 07/05/16  Yes Shawnee Knapp, MD   Social History   Social History  . Marital status: Married    Spouse name: N/A  . Number of children: N/A  . Years of education: N/A   Occupational History  . Not on file.   Social History Main Topics  . Smoking status: Former Research scientist (life sciences)  . Smokeless tobacco: Never Used  . Alcohol use 0.0 oz/week  . Drug use: No  . Sexual activity: Yes   Other Topics Concern  . Not on file   Social History Narrative  . No narrative on file   Review of Systems  Constitutional: Positive for fever. Negative for chills.  HENT: Positive for congestion, sinus pressure and sore throat. Negative for trouble swallowing.   Respiratory: Positive for cough. Negative for shortness of breath.   Cardiovascular: Negative for chest pain and palpitations.  Psychiatric/Behavioral: Negative for sleep disturbance.       Objective:   Physical Exam  Constitutional: She is oriented to person, place, and time. She appears well-developed and well-nourished. No distress.  HENT:  Head: Normocephalic and atraumatic.  Right Ear: Tympanic membrane is erythematous. A middle ear effusion  is present.  Left Ear: Tympanic membrane is erythematous. A middle ear effusion is present.  Mouth/Throat: Posterior oropharyngeal erythema present.  Postnasal drip  Eyes: Conjunctivae and EOM are normal.  Neck: Neck supple.  Cardiovascular: Normal rate.   Pulmonary/Chest: Effort normal.  Musculoskeletal: Normal range of motion.  Lymphadenopathy:    She has cervical adenopathy.  Neurological: She is alert and oriented to person, place, and time.  Skin: Skin is warm and dry.  Psychiatric: She has  a normal mood and affect. Her behavior is normal.  Nursing note and vitals reviewed.  Vitals:   10/08/16 1424  BP: 110/66  Pulse: 67  Resp: 16  Temp: 98.2 F (36.8 C)  TempSrc: Oral  SpO2: 99%  Weight: 172 lb (78 kg)  Height: 5\' 5"  (1.651 m)   Assessment & Plan:   1. URI, acute   Suspect viral but snap amox rx given for pt to fill if she does not improve in the next 2d. Stop neosynephrine. Cont sudafed.  Meds ordered this encounter  Medications  . azithromycin (ZITHROMAX) 250 MG tablet    Sig: Take 2 tabs PO x 1 dose, then 1 tab PO QD x 4 days    Dispense:  6 tablet    Refill:  0  . ipratropium (ATROVENT) 0.03 % nasal spray    Sig: Place 2 sprays into the nose 4 (four) times daily.    Dispense:  30 mL    Refill:  1  . Guaifenesin (MUCINEX MAXIMUM STRENGTH) 1200 MG TB12    Sig: Take 1 tablet (1,200 mg total) by mouth every 12 (twelve) hours as needed.    Dispense:  14 tablet    Refill:  1  . pseudoephedrine (SUDAFED 12 HOUR) 120 MG 12 hr tablet    Sig: Take 1 tablet (120 mg total) by mouth 2 (two) times daily.    Dispense:  30 tablet    Refill:  0  I personally performed the services described in this documentation, which was scribed in my presence. The recorded information has been reviewed and considered, and addended by me as needed.   Delman Cheadle, M.D.  [facility] [address] [place], [place] [address] [phone] phone [phone] fax  10/08/16 9:42 PM

## 2016-10-08 NOTE — Patient Instructions (Addendum)
I suspect that this is viral and that you will start to feel better after about 4 days of illness. If you are not starting to feel significantly better by Wednesday go ahead and fill the Z-Pak pack antibiotic prescription and start that on Wednesday. While it is only 5 days it will last your body for almost 2 weeks so don't worry if you're not completely well by the end of the antibiotic course.  IF you received an x-ray today, you will receive an invoice from [facility]. Please contact [facility] at [phone] with questions or concerns regarding your invoice.   IF you received labwork today, you will receive an invoice from [facility]. Please contact [facility] at [phone] with questions or concerns regarding your invoice.   Our billing staff will not be able to assist you with questions regarding bills from these companies.  You will be contacted with the lab results as soon as they are available. The fastest way to get your results is to activate your My Chart account. Instructions are located on the last page of this paperwork. If you have not heard from [place] regarding the results in 2 weeks, please contact this office.      Upper Respiratory Infection, Adult Most upper respiratory infections (URIs) are a viral infection of the air passages leading to the lungs. A URI affects the nose, throat, and upper air passages. The most common type of URI is nasopharyngitis and is typically referred to as "the common cold." URIs run their course and usually go away on their own. Most of the time, a URI does not require medical attention, but sometimes a bacterial infection in the upper airways can follow a viral infection. This is called a secondary infection. Sinus and middle ear infections are common types of secondary upper respiratory infections. Bacterial pneumonia can also complicate a URI. A URI can worsen asthma and chronic obstructive pulmonary  disease (COPD). Sometimes, these complications can require emergency medical care and may be life threatening. What are the causes? Almost all URIs are caused by viruses. A virus is a type of germ and can spread from one person to another. What increases the risk? You may be at risk for a URI if:  You smoke.  You have chronic heart or lung disease.  You have a weakened defense (immune) system.  You are very young or very old.  You have nasal allergies or asthma.  You work in crowded or poorly ventilated areas.  You work in health care facilities or schools. What are the signs or symptoms? Symptoms typically develop 2-3 days after you come in contact with a cold virus. Most viral URIs last 7-10 days. However, viral URIs from the influenza virus (flu virus) can last 14-18 days and are typically more severe. Symptoms may include:  Runny or stuffy (congested) nose.  Sneezing.  Cough.  Sore throat.  Headache.  Fatigue.  Fever.  Loss of appetite.  Pain in your forehead, behind your eyes, and over your cheekbones (sinus pain).  Muscle aches. How is this diagnosed? Your health care provider may diagnose a URI by:  Physical exam.  Tests to check that your symptoms are not due to another condition such as:  Strep throat.  Sinusitis.  Pneumonia.  Asthma. How is this treated? A URI goes away on its own with time. It cannot be cured with medicines, but medicines may be prescribed or recommended to relieve symptoms. Medicines may help:  Reduce  your fever.  Reduce your cough.  Relieve nasal congestion. Follow these instructions at home:  Take medicines only as directed by your health care provider.  Gargle warm saltwater or take cough drops to comfort your throat as directed by your health care provider.  Use a warm mist humidifier or inhale steam from a shower to increase air moisture. This may make it easier to breathe.  Drink enough fluid to keep your urine  clear or pale yellow.  Eat soups and other clear broths and maintain good nutrition.  Rest as needed.  Return to work when your temperature has returned to normal or as your health care provider advises. You may need to stay home longer to avoid infecting others. You can also use a face mask and careful hand washing to prevent spread of the virus.  Increase the usage of your inhaler if you have asthma.  Do not use any tobacco products, including cigarettes, chewing tobacco, or electronic cigarettes. If you need help quitting, ask your health care provider. How is this prevented? The best way to protect yourself from getting a cold is to practice good hygiene.  Avoid oral or hand contact with people with cold symptoms.  Wash your hands often if contact occurs. There is no clear evidence that vitamin C, vitamin E, echinacea, or exercise reduces the chance of developing a cold. However, it is always recommended to get plenty of rest, exercise, and practice good nutrition. Contact a health care provider if:  You are getting worse rather than better.  Your symptoms are not controlled by medicine.  You have chills.  You have worsening shortness of breath.  You have brown or red mucus.  You have yellow or brown nasal discharge.  You have pain in your face, especially when you bend forward.  You have a fever.  You have swollen neck glands.  You have pain while swallowing.  You have white areas in the back of your throat. Get help right away if:  You have severe or persistent:  Headache.  Ear pain.  Sinus pain.  Chest pain.  You have chronic lung disease and any of the following:  Wheezing.  Prolonged cough.  Coughing up blood.  A change in your usual mucus.  You have a stiff neck.  You have changes in your:  Vision.  Hearing.  Thinking.  Mood. This information is not intended to replace advice given to you by your health care provider. Make sure you  discuss any questions you have with your health care provider. Document Released: 05/01/2001 Document Revised: 07/08/2016 Document Reviewed: 02/10/2014 Elsevier Interactive Patient Education  2017 [employer].

## 2016-12-23 ENCOUNTER — Other Ambulatory Visit: Payer: Self-pay | Admitting: Family Medicine

## 2016-12-23 NOTE — Telephone Encounter (Signed)
05/2016 last refill with 1 add

## 2016-12-26 NOTE — Telephone Encounter (Signed)
Called to pharmacy 

## 2016-12-26 NOTE — Telephone Encounter (Signed)
Yes, fine to refill this.

## 2017-01-25 ENCOUNTER — Other Ambulatory Visit: Payer: Self-pay | Admitting: Family Medicine

## 2017-01-25 DIAGNOSIS — Z1231 Encounter for screening mammogram for malignant neoplasm of breast: Secondary | ICD-10-CM

## 2017-03-07 ENCOUNTER — Other Ambulatory Visit: Payer: Self-pay | Admitting: Family Medicine

## 2017-03-08 NOTE — Telephone Encounter (Signed)
12/26/16 last refill 06/2016 last ov

## 2017-03-09 NOTE — Telephone Encounter (Signed)
Called to pharmacy 

## 2017-03-11 ENCOUNTER — Ambulatory Visit
Admission: RE | Admit: 2017-03-11 | Discharge: 2017-03-11 | Disposition: A | Discharge status: Home / Self Care | Payer: 59 | Source: Ambulatory Visit | Attending: Family Medicine | Admitting: Family Medicine

## 2017-03-11 DIAGNOSIS — Z1231 Encounter for screening mammogram for malignant neoplasm of breast: Secondary | ICD-10-CM

## 2017-03-21 ENCOUNTER — Ambulatory Visit (INDEPENDENT_AMBULATORY_CARE_PROVIDER_SITE_OTHER): Payer: 59 | Admitting: Family Medicine

## 2017-03-21 ENCOUNTER — Encounter: Payer: Self-pay | Admitting: Family Medicine

## 2017-03-21 VITALS — BP 121/84 | HR 70 | Temp 98.5°F | Resp 16 | Ht 65.0 in | Wt 170.0 lb

## 2017-03-21 DIAGNOSIS — M25562 Pain in left knee: Secondary | ICD-10-CM | POA: Diagnosis not present

## 2017-03-21 DIAGNOSIS — M2550 Pain in unspecified joint: Secondary | ICD-10-CM

## 2017-03-21 DIAGNOSIS — E663 Overweight: Secondary | ICD-10-CM | POA: Diagnosis not present

## 2017-03-21 DIAGNOSIS — F411 Generalized anxiety disorder: Secondary | ICD-10-CM | POA: Diagnosis not present

## 2017-03-21 DIAGNOSIS — E8940 Asymptomatic postprocedural ovarian failure: Secondary | ICD-10-CM | POA: Diagnosis not present

## 2017-03-21 DIAGNOSIS — F988 Other specified behavioral and emotional disorders with onset usually occurring in childhood and adolescence: Secondary | ICD-10-CM

## 2017-03-21 DIAGNOSIS — F329 Major depressive disorder, single episode, unspecified: Secondary | ICD-10-CM

## 2017-03-21 DIAGNOSIS — M25561 Pain in right knee: Secondary | ICD-10-CM

## 2017-03-21 MED ORDER — METHYLPHENIDATE HCL ER (OSM) 18 MG PO TBCR: 18.0000 mg | EXTENDED_RELEASE_TABLET | Freq: Every day | ORAL | 0 refills | Status: DC

## 2017-03-21 MED ORDER — ALPRAZOLAM 0.25 MG PO TABS: 0.2500 mg | ORAL_TABLET | Freq: Two times a day (BID) | ORAL | 1 refills | Status: DC | PRN

## 2017-03-21 NOTE — Patient Instructions (Signed)
     IF you received an x-ray today, you will receive an invoice from [facility]. Please contact [facility] at [phone] with questions or concerns regarding your invoice.   IF you received labwork today, you will receive an invoice from [facility]. Please contact [facility] at [phone] with questions or concerns regarding your invoice.   Our billing staff will not be able to assist you with questions regarding bills from these companies.  You will be contacted with the lab results as soon as they are available. The fastest way to get your results is to activate your My Chart account. Instructions are located on the last page of this paperwork. If you have not heard from us regarding the results in 2 weeks, please contact this office.     
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
## 2017-03-23 DIAGNOSIS — F329 Major depressive disorder, single episode, unspecified: Secondary | ICD-10-CM | POA: Insufficient documentation

## 2017-03-23 DIAGNOSIS — E663 Overweight: Secondary | ICD-10-CM | POA: Insufficient documentation

## 2017-03-23 DIAGNOSIS — M2550 Pain in unspecified joint: Secondary | ICD-10-CM | POA: Insufficient documentation

## 2017-03-23 DIAGNOSIS — F411 Generalized anxiety disorder: Secondary | ICD-10-CM | POA: Insufficient documentation

## 2017-03-23 DIAGNOSIS — M25561 Pain in right knee: Secondary | ICD-10-CM | POA: Insufficient documentation

## 2017-03-23 DIAGNOSIS — F988 Other specified behavioral and emotional disorders with onset usually occurring in childhood and adolescence: Secondary | ICD-10-CM | POA: Insufficient documentation

## 2017-03-23 DIAGNOSIS — M25562 Pain in left knee: Secondary | ICD-10-CM

## 2017-04-19 ENCOUNTER — Ambulatory Visit (INDEPENDENT_AMBULATORY_CARE_PROVIDER_SITE_OTHER): Payer: 59 | Admitting: Family Medicine

## 2017-04-19 ENCOUNTER — Encounter: Payer: Self-pay | Admitting: Family Medicine

## 2017-04-19 ENCOUNTER — Ambulatory Visit: Payer: 59 | Admitting: Family Medicine

## 2017-04-19 VITALS — BP 116/74 | HR 85 | Temp 98.0°F | Resp 18 | Ht 65.16 in | Wt 170.0 lb

## 2017-04-19 DIAGNOSIS — F988 Other specified behavioral and emotional disorders with onset usually occurring in childhood and adolescence: Secondary | ICD-10-CM

## 2017-04-19 MED ORDER — METHYLPHENIDATE HCL 10 MG PO TABS: 10.0000 mg | ORAL_TABLET | Freq: Two times a day (BID) | ORAL | 0 refills | Status: DC

## 2017-04-19 NOTE — Progress Notes (Addendum)
Subjective:  By signing my name below, I, Danielle Davidson, attest that this documentation has been prepared under the direction and in the presence of Delman Cheadle, MD Electronically Signed: Ladene Artist, ED Scribe 04/19/2017 at 6:39 PM.   Patient ID: Danielle Davidson, female    DOB: 1966/02/25, 51 y.o.   MRN: [id number]  Chief Complaint  Patient presents with  . Medication Management    follow-up with meds    HPI Danielle Davidson is a 51 y.o. female who presents to Primary Care at [facility] for medication management. Pt has tapered off of Zoloft which she has been completely off of for 1.5 week. She reports feeling anxious, irritable, cranky and uncontrollable crying spells since stopping the medication. Has seen therapists throughout her adult life for depression. Her therapist, Danielle Davidson, suspects she is ADHD. Pt states that she tried Concerta, which made her feel calmer and helped with concentration, but she was unable to really sleep for approximately 1 week. Also reports heart palpitations and appetite suppresion with Concerta. Pt states she tried Melatonin which helped her a little throughout half the day, however she noticed that she was "crashing" in the afternoon.   Past Medical History:  Diagnosis Date  . Allergy   . Arthritis    Current Outpatient Prescriptions on File Prior to Visit  Medication Sig Dispense Refill  . ALPRAZolam (XANAX) 0.25 MG tablet Take 1 tablet (0.25 mg total) by mouth 2 (two) times daily as needed. 60 tablet 1   No current facility-administered medications on file prior to visit.    Allergies  Allergen Reactions  . Codeine Nausea Only   Review of Systems  Psychiatric/Behavioral: Positive for agitation and sleep disturbance. The patient is nervous/anxious.       Objective:   Physical Exam  Constitutional: She is oriented to person, place, and time. She appears well-developed and well-nourished. No distress.  HENT:  Head: Normocephalic and atraumatic.    Eyes: Conjunctivae and EOM are normal.  Neck: Neck supple. No tracheal deviation present. No thyromegaly present.  Cardiovascular: Normal rate, regular rhythm, S1 normal, S2 normal and normal heart sounds.   Pulmonary/Chest: Effort normal and breath sounds normal. No respiratory distress.  Musculoskeletal: Normal range of motion.  Lymphadenopathy:    She has no cervical adenopathy.  Neurological: She is alert and oriented to person, place, and time.  Skin: Skin is warm and dry.  Psychiatric: She has a normal mood and affect. Her behavior is normal.  Nursing note and vitals reviewed.  BP 116/74   Pulse 85   Temp 98 F (36.7 C) (Oral)   Resp 18   Ht 5' 5.16" (1.655 m)   Wt 170 lb (77.1 kg)   LMP 08/13/2012   SpO2 98%   BMI 28.15 kg/m     Assessment & Plan:   1. Attention deficit disorder (ADD) without hyperactivity   Send my chart message to let me know how she does on below. If does okay may want to try to switch to intermediate acting like Metadate ER the patient interested in trying Focalin XR as works for some family members.. If does not do okay will try to switch to amphetamine salt. Patient really wants to continue on one thing for now so we'll hold off on further trials of BuSpar and/or daily at bedtime clonidine as the meds that were suggested by her therapist.  Meds ordered this encounter  Medications  . Cholecalciferol (VITAMIN D PO)    Sig: Take by mouth.  Marland Kitchen  vitamin B-12 (CYANOCOBALAMIN) 100 MCG tablet    Sig: Take 100 mcg by mouth daily.  . methylphenidate (RITALIN) 10 MG tablet    Sig: Take 1 tablet (10 mg total) by mouth 2 (two) times daily with breakfast and lunch.    Dispense:  60 tablet    Refill:  0   Over 25 min spent in face-to-face evaluation of and consultation with patient and coordination of care.  Over 50% of this time was spent counseling this patient.  I personally performed the services described in this documentation, which was scribed in my  presence. The recorded information has been reviewed and considered, and addended by me as needed.   Delman Cheadle, M.D.  Primary Care at [facility] [address] [place], [place] [address] [phone] phone [phone] fax  04/21/17 11:57 PM

## 2017-04-19 NOTE — Patient Instructions (Addendum)
     IF you received an x-ray today, you will receive an invoice from [facility]. Please contact [facility] at [phone] with questions or concerns regarding your invoice.   IF you received labwork today, you will receive an invoice from [facility]. Please contact [facility] at [phone] with questions or concerns regarding your invoice.   Our billing staff will not be able to assist you with questions regarding bills from these companies.  You will be contacted with the lab results as soon as they are available. The fastest way to get your results is to activate your My Chart account. Instructions are located on the last page of this paperwork. If you have not heard from us regarding the results in 2 weeks, please contact this office.     

## 2017-04-24 ENCOUNTER — Encounter: Payer: Self-pay | Admitting: Family Medicine

## 2017-05-01 ENCOUNTER — Encounter: Payer: Self-pay | Admitting: Family Medicine

## 2017-05-07 ENCOUNTER — Encounter: Payer: Self-pay | Admitting: Family Medicine

## 2017-05-11 ENCOUNTER — Ambulatory Visit (INDEPENDENT_AMBULATORY_CARE_PROVIDER_SITE_OTHER): Payer: 59 | Admitting: Family Medicine

## 2017-05-11 ENCOUNTER — Encounter: Payer: Self-pay | Admitting: Family Medicine

## 2017-05-11 VITALS — BP 117/78 | HR 67 | Temp 98.3°F | Resp 16 | Ht 65.5 in | Wt 171.2 lb

## 2017-05-11 DIAGNOSIS — F988 Other specified behavioral and emotional disorders with onset usually occurring in childhood and adolescence: Secondary | ICD-10-CM

## 2017-05-11 DIAGNOSIS — E349 Endocrine disorder, unspecified: Secondary | ICD-10-CM | POA: Diagnosis not present

## 2017-05-11 MED ORDER — AMPHETAMINE-DEXTROAMPHET ER 10 MG PO CP24: 10.0000 mg | ORAL_CAPSULE | Freq: Every day | ORAL | 0 refills | Status: DC

## 2017-05-11 NOTE — Progress Notes (Signed)
Subjective:  By signing my name below, I, Moises Blood, attest that this documentation has been prepared under the direction and in the presence of Delman Cheadle, MD. Electronically Signed: Moises Blood, Colon. 05/11/2017 , 11:06 AM .  Patient was seen in Room 9 .   Patient ID: Danielle Davidson, female    DOB: 05-Sep-1966, 51 y.o.   MRN: [id number] Chief Complaint  Patient presents with  . ADD   HPI Danielle Davidson is a 51 y.o. female who presents to Primary Care at [facility] for follow up. Patient states she's been playing around on the dosing of her Ritalin 5mg  prescription, as initially she wasn't able to sleep much. She reports being able to sleep on her medication now when taking 1 tablet in the morning, and half tablet at night. But she started to "crash" hard later in the day. She is still working with her therapist, Edwena Blow; last seen yesterday. Shana recommended to try Vyvanse.   She's been having lack of focus and continuing joint pain since her hysterectomy, and the loss of muscle tone throughout her body. She would like for testosterone therapy for menopausal symptoms. She is not fasting today, as she has eaten 3 boiled eggs prior to visit.   She informs having a new job in [place], [place].   Past Medical History:  Diagnosis Date  . Allergy   . Arthritis    Prior to Admission medications   Medication Sig Start Date End Date Taking? Authorizing Provider  ALPRAZolam (XANAX) 0.25 MG tablet Take 1 tablet (0.25 mg total) by mouth 2 (two) times daily as needed. 03/21/17  Yes Shawnee Knapp, MD  Cholecalciferol (VITAMIN D PO) Take by mouth.   Yes [provider]  vitamin B-12 (CYANOCOBALAMIN) 100 MCG tablet Take 100 mcg by mouth daily.   Yes [provider]   Allergies  Allergen Reactions  . Codeine Nausea Only   Review of Systems  Constitutional: Negative for chills, fatigue, fever and unexpected weight change.  Respiratory: Negative for cough.   Gastrointestinal:  Negative for constipation, diarrhea, nausea and vomiting.  Musculoskeletal: Positive for arthralgias.  Skin: Negative for rash and wound.  Neurological: Negative for dizziness, weakness and headaches.       Objective:   Physical Exam  Constitutional: She is oriented to person, place, and time. She appears well-developed and well-nourished. No distress.  HENT:  Head: Normocephalic and atraumatic.  Eyes: EOM are normal. Pupils are equal, round, and reactive to light.  Neck: Neck supple.  Cardiovascular: Normal rate.   Pulmonary/Chest: Effort normal. No respiratory distress.  Musculoskeletal: Normal range of motion.  Neurological: She is alert and oriented to person, place, and time.  Skin: Skin is warm and dry.  Psychiatric: She has a normal mood and affect. Her behavior is normal.  Nursing note and vitals reviewed.   BP 117/78   Pulse 67   Temp 98.3 F (36.8 C) (Oral)   Resp 16   Ht 5' 5.5" (1.664 m)   Wt 171 lb 3.2 oz (77.7 kg)   LMP 08/13/2012   SpO2 98%   BMI 28.06 kg/m     Assessment & Plan:   1. Hormone imbalance   2. Attention deficit disorder (ADD) in adult     Orders Placed This Encounter  Procedures  . LDL Cholesterol, Direct  . Lipid panel    Order Specific Question:   Has the patient fasted?    Answer:   Yes  . TestT+TestF+SHBG  Meds ordered this encounter  Medications  . amphetamine-dextroamphetamine (ADDERALL XR) 10 MG 24 hr capsule    Sig: Take 1 capsule (10 mg total) by mouth daily.    Dispense:  30 capsule    Refill:  0    I personally performed the services described in this documentation, which was scribed in my presence. The recorded information has been reviewed and considered, and addended by me as needed.   Delman Cheadle, M.D.  Primary Care at [facility] [address] [place], [place] [address] [phone] phone [phone] fax  05/13/17 10:40 PM

## 2017-05-11 NOTE — Patient Instructions (Addendum)
Adderall XR usually lasts a little less long than the Vyvanse. If you are having any side effects, than we can switch to the vyvanse. Vyvanse is typically "smoother." Send me a MyChart email but if you don't hear back within several days, you can send me a text to let me know you have a message waiting for me - [phone]. You can double up on your Adderall XR if it is not effective enough    IF you received an x-ray today, you will receive an invoice from [facility]. Please contact [facility] at [phone] with questions or concerns regarding your invoice.   IF you received labwork today, you will receive an invoice from [facility]. Please contact [facility] at [phone] with questions or concerns regarding your invoice.   Our billing staff will not be able to assist you with questions regarding bills from these companies.  You will be contacted with the lab results as soon as they are available. The fastest way to get your results is to activate your My Chart account. Instructions are located on the last page of this paperwork. If you have not heard from [place] regarding the results in 2 weeks, please contact this office.

## 2017-05-15 LAB — LDL CHOLESTEROL, DIRECT: LDL Direct: 134 mg/dL — ABNORMAL HIGH (ref 0–99)

## 2017-05-15 LAB — LIPID PANEL
Chol/HDL Ratio: 4 ratio (ref 0.0–4.4)
Cholesterol, Total: 202 mg/dL — ABNORMAL HIGH (ref 100–199)
HDL: 51 mg/dL (ref >39)
LDL Calculated: 125 mg/dL — ABNORMAL HIGH (ref 0–99)
Triglycerides: 130 mg/dL (ref 0–149)
VLDL Cholesterol Cal: 26 mg/dL (ref 5–40)

## 2017-05-15 LAB — TESTT+TESTF+SHBG
Sex Hormone Binding: 84.5 nmol/L (ref 17.3–125.0)
TESTOSTERONE FREE: 1.2 pg/mL (ref 0.0–4.2)
TESTOSTERONE, TOTAL: 18.1 ng/dL

## 2017-05-17 ENCOUNTER — Encounter: Payer: Self-pay | Admitting: Family Medicine

## 2017-05-17 ENCOUNTER — Telehealth: Payer: Self-pay | Admitting: Family Medicine

## 2017-05-17 DIAGNOSIS — Z5181 Encounter for therapeutic drug level monitoring: Secondary | ICD-10-CM

## 2017-05-17 NOTE — Telephone Encounter (Signed)
Please release lab results 

## 2017-05-17 NOTE — Telephone Encounter (Addendum)
Done. Need to call pt's therapist Carol Ada at [facility].

## 2017-05-17 NOTE — Telephone Encounter (Signed)
Pt is looking for lab results   Best number [phone]

## 2017-05-18 MED ORDER — TESTOSTERONE 2 MG/24HR TD PT24: 2.0000 mg | MEDICATED_PATCH | Freq: Every day | TRANSDERMAL | 1 refills | Status: DC

## 2017-05-20 ENCOUNTER — Other Ambulatory Visit: Payer: 59 | Admitting: Emergency Medicine

## 2017-05-20 DIAGNOSIS — Z5181 Encounter for therapeutic drug level monitoring: Secondary | ICD-10-CM

## 2017-05-20 NOTE — Telephone Encounter (Signed)
LVM at [facility] office at 9:30 this morning with my cell # and times of availability.

## 2017-05-21 ENCOUNTER — Telehealth: Payer: Self-pay | Admitting: Family Medicine

## 2017-05-21 DIAGNOSIS — Z5181 Encounter for therapeutic drug level monitoring: Secondary | ICD-10-CM

## 2017-05-21 LAB — CBC WITH DIFFERENTIAL/PLATELET
BASOS: 1 %
Basophils Absolute: 0 10*3/uL (ref 0.0–0.2)
EOS (ABSOLUTE): 0.2 10*3/uL (ref 0.0–0.4)
EOS: 3 %
HEMATOCRIT: 42.8 % (ref 34.0–46.6)
HEMOGLOBIN: 13.7 g/dL (ref 11.1–15.9)
IMMATURE GRANS (ABS): 0 10*3/uL (ref 0.0–0.1)
IMMATURE GRANULOCYTES: 0 %
LYMPHS: 40 %
Lymphocytes Absolute: 1.8 10*3/uL (ref 0.7–3.1)
MCH: 27.9 pg (ref 26.6–33.0)
MCHC: 32 g/dL (ref 31.5–35.7)
MCV: 87 fL (ref 79–97)
MONOCYTES: 9 %
Monocytes Absolute: 0.4 10*3/uL (ref 0.1–0.9)
NEUTROS ABS: 2.2 10*3/uL (ref 1.4–7.0)
NEUTROS PCT: 47 %
PLATELETS: 242 10*3/uL (ref 150–379)
RBC: 4.91 x10E6/uL (ref 3.77–5.28)
RDW: 13.8 % (ref 12.3–15.4)
WBC: 4.6 10*3/uL (ref 3.4–10.8)

## 2017-05-21 LAB — ESTRADIOL: Estradiol: 5.6 pg/mL

## 2017-05-21 LAB — COMPREHENSIVE METABOLIC PANEL
A/G RATIO: 1.6 (ref 1.2–2.2)
ALBUMIN: 4.2 g/dL (ref 3.5–5.5)
ALT: 22 IU/L (ref 0–32)
AST: 21 IU/L (ref 0–40)
Alkaline Phosphatase: 129 IU/L — ABNORMAL HIGH (ref 39–117)
BUN / CREAT RATIO: 17 (ref 9–23)
BUN: 14 mg/dL (ref 6–24)
Bilirubin Total: 0.3 mg/dL (ref 0.0–1.2)
CALCIUM: 9.5 mg/dL (ref 8.7–10.2)
CO2: 24 mmol/L (ref 20–29)
Chloride: 101 mmol/L (ref 96–106)
Creatinine, Ser: 0.81 mg/dL (ref 0.57–1.00)
GFR, EST AFRICAN AMERICAN: 97 mL/min/{1.73_m2} (ref >59)
GFR, EST NON AFRICAN AMERICAN: 84 mL/min/{1.73_m2} (ref >59)
Globulin, Total: 2.6 g/dL (ref 1.5–4.5)
Glucose: 98 mg/dL (ref 65–99)
POTASSIUM: 4.3 mmol/L (ref 3.5–5.2)
Sodium: 140 mmol/L (ref 134–144)
TOTAL PROTEIN: 6.8 g/dL (ref 6.0–8.5)

## 2017-05-21 NOTE — Telephone Encounter (Signed)
Called patient. Testosterone patches were covered under her insurance plan but just picked them up today so hasn't tried yet. She is doing well. No questions or concerns about potential side effects and risks. She will try and we will plan to repeat labs in several weeks. Recommendation is to repeat lipids, complete metabolic, blood counts, testosterone, and estradiol (6 weeks after initiation but patient is planning to move to [facility] around August 10 as she just accepted a job offer.  If she is doing well, will repeat labs in approximately 3-4 weeks so we have time to deal with results and change medication before she leaves if needed. If she is not noticing any mood changes and is concerned the dose is not high enough or if she is experiencing too many effects and we are concerned the dose is too high, recommend she come in for labs sooner. Future lab orders entered.  If symptoms or levels are abnml, as may then need to transition to injectable as there is no data on how patches perform when cut.   When she moves, it should be relatively easy for her to find a private lab facility where I can fax over orders - should have the above panel of lipids, complete metabolic, blood counts, testosterone, and estradiol repeated every 6 weeks until medication dose is stabilized then every 6 months.    The goal range for the testosterone level is [phone] ng/dL and will let her future symptom/preferences guide [place] into where she falls within that range.

## 2017-05-21 NOTE — Telephone Encounter (Signed)
PATIENT STATES DR. SHAW SPOKE WITH HER THERAPIST TODAY (05/21/17) AND THE THERAPIST TOLD HER THAT DR. SHAW WANTED HER TO CALL. SHE IS CALLING TO SPEAK WITH DR. SHAW LIKE HER THERAPIST SAID. BEST PHONE [phone] (CELL) [place]

## 2017-05-24 ENCOUNTER — Encounter: Payer: Self-pay | Admitting: Family Medicine

## 2017-05-28 IMAGING — MG DIGITAL SCREENING BILATERAL MAMMOGRAM WITH CAD
4 series · 4 of 4 positions shown · non-contrast
Comparison: Previous exam(s).

CLINICAL DATA: Screening.

EXAM:
DIGITAL SCREENING BILATERAL MAMMOGRAM WITH CAD

[L MLO]
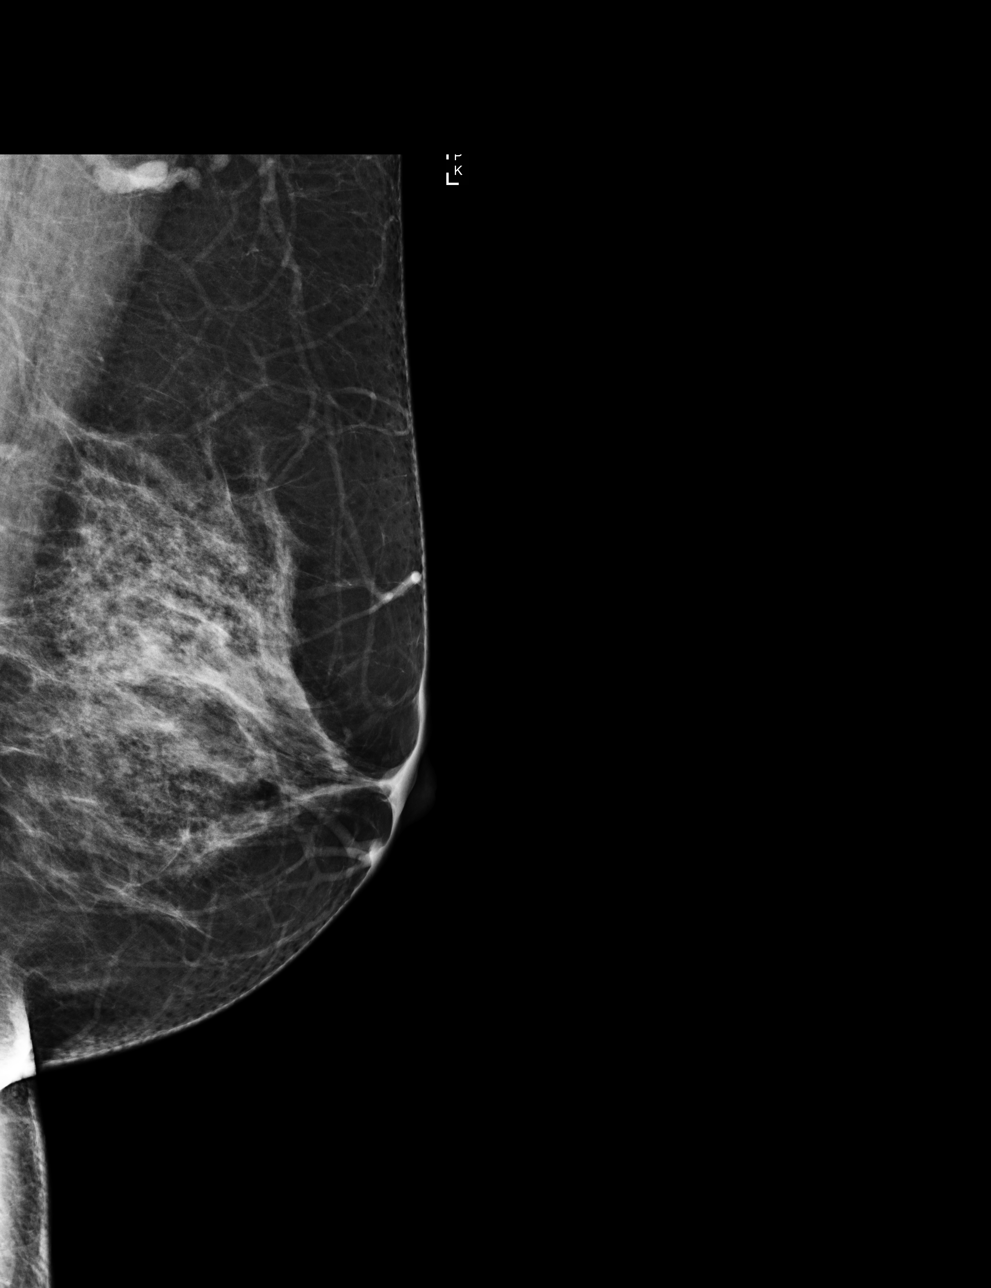

[R MLO]
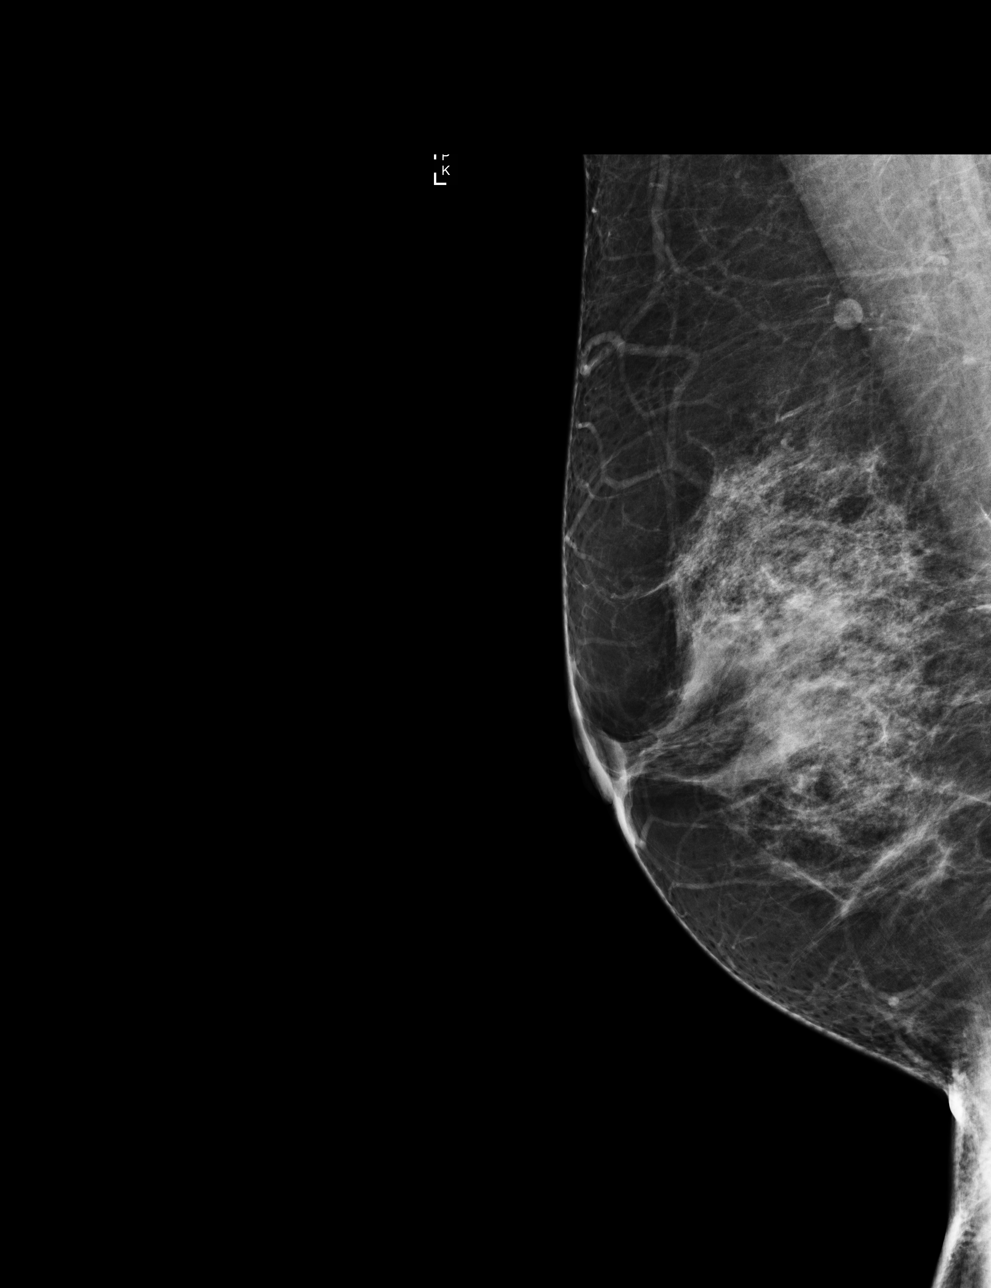

[R CC]
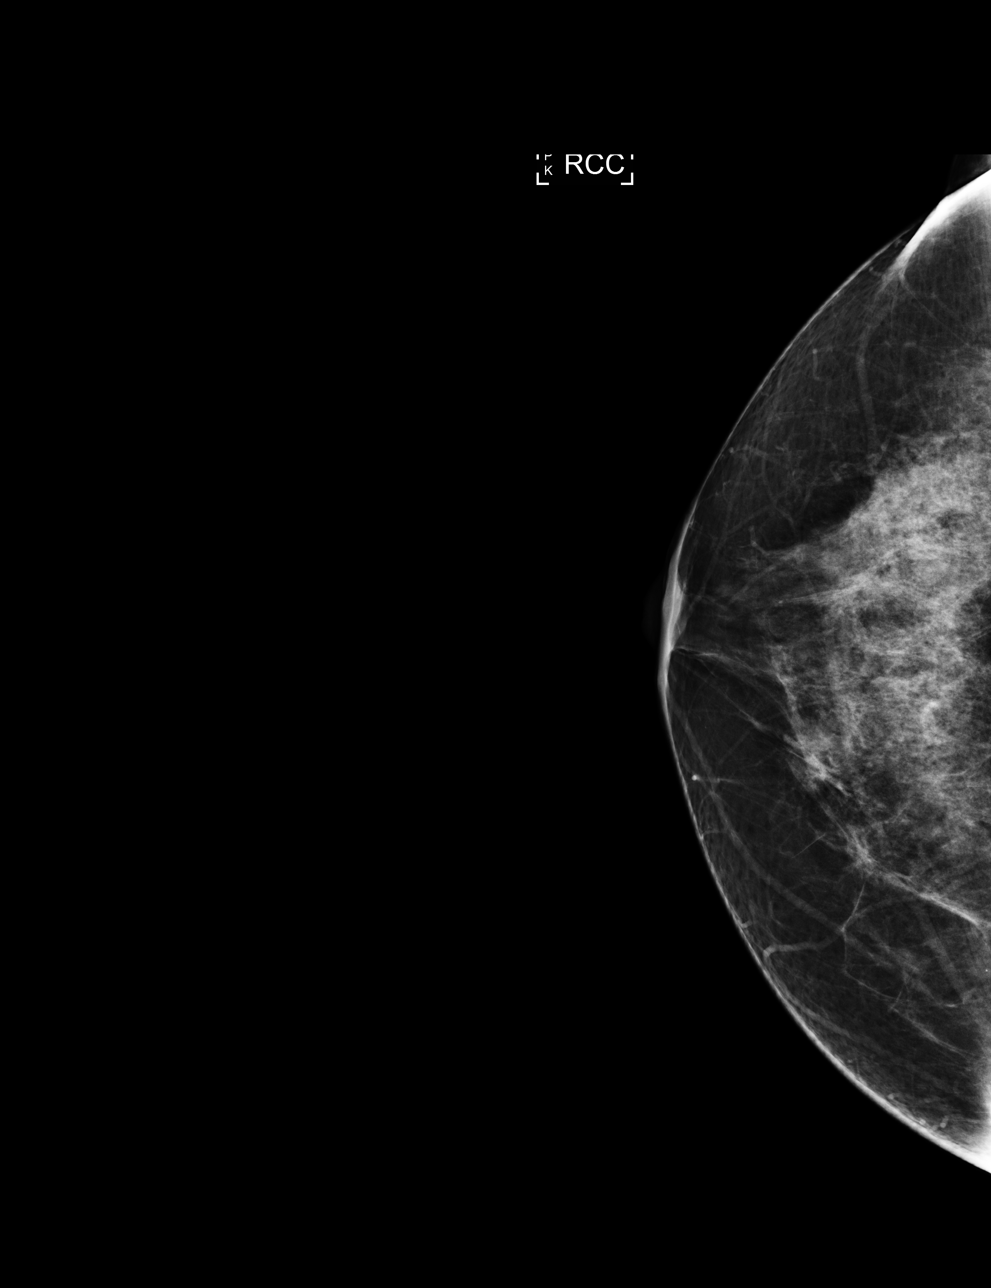

[L CC]
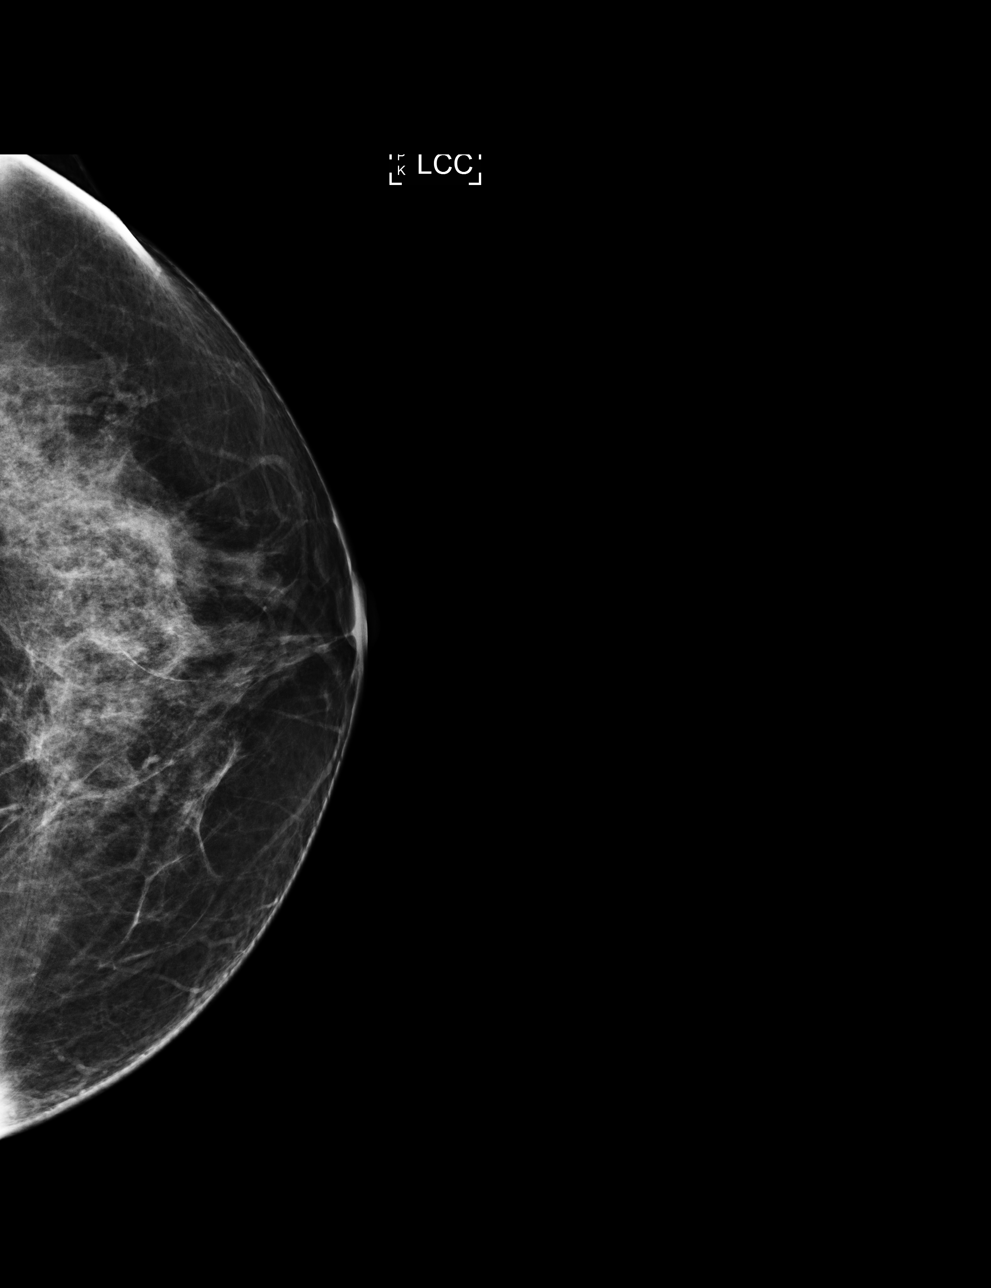

[4 of 4 positions shown; findings below may reference images not displayed]

ACR Breast Density Category c: The breast tissue is heterogeneously
dense, which may obscure small masses.
FINDINGS: There are no findings suspicious for malignancy. Images were
processed with CAD.
IMPRESSION: No mammographic evidence of malignancy. A result letter of this
screening mammogram will be mailed directly to the patient.

RECOMMENDATION:
Screening mammogram in one year. (Code:YJ-[id number])

BI-RADS CATEGORY  1: Negative.

## 2017-05-29 ENCOUNTER — Encounter: Payer: Self-pay | Admitting: Family Medicine

## 2017-06-02 ENCOUNTER — Encounter: Payer: Self-pay | Admitting: Family Medicine

## 2017-06-02 DIAGNOSIS — R5383 Other fatigue: Secondary | ICD-10-CM

## 2017-06-04 NOTE — Telephone Encounter (Signed)
Patients wants orders put in so she can come in and get lab work done and check her thyroid levels so the results will be back before her appt next week with Dr Brigitte Pulse.  Her call back number is [phone]

## 2017-06-07 ENCOUNTER — Ambulatory Visit: Payer: 59 | Admitting: Urgent Care

## 2017-06-07 DIAGNOSIS — Z5181 Encounter for therapeutic drug level monitoring: Secondary | ICD-10-CM

## 2017-06-07 NOTE — Telephone Encounter (Signed)
Labs were already put in on 7/3 - just see chart.

## 2017-06-09 MED ORDER — LISDEXAMFETAMINE DIMESYLATE 40 MG PO CAPS: 40.0000 mg | ORAL_CAPSULE | ORAL | 0 refills | Status: DC

## 2017-06-12 ENCOUNTER — Encounter: Payer: Self-pay | Admitting: Family Medicine

## 2017-06-12 ENCOUNTER — Ambulatory Visit (INDEPENDENT_AMBULATORY_CARE_PROVIDER_SITE_OTHER): Payer: 59 | Admitting: Family Medicine

## 2017-06-12 VITALS — BP 117/79 | HR 84 | Temp 98.0°F | Resp 17 | Ht 65.5 in | Wt 167.0 lb

## 2017-06-12 DIAGNOSIS — F988 Other specified behavioral and emotional disorders with onset usually occurring in childhood and adolescence: Secondary | ICD-10-CM

## 2017-06-12 DIAGNOSIS — R5383 Other fatigue: Secondary | ICD-10-CM | POA: Diagnosis not present

## 2017-06-12 DIAGNOSIS — F4323 Adjustment disorder with mixed anxiety and depressed mood: Secondary | ICD-10-CM | POA: Diagnosis not present

## 2017-06-12 DIAGNOSIS — E349 Endocrine disorder, unspecified: Secondary | ICD-10-CM | POA: Diagnosis not present

## 2017-06-12 LAB — COMPREHENSIVE METABOLIC PANEL
ALT: 20 IU/L (ref 0–32)
AST: 21 IU/L (ref 0–40)
Albumin/Globulin Ratio: 1.6 (ref 1.2–2.2)
Albumin: 4.6 g/dL (ref 3.5–5.5)
Alkaline Phosphatase: 129 IU/L — ABNORMAL HIGH (ref 39–117)
BUN/Creatinine Ratio: 14 (ref 9–23)
BUN: 13 mg/dL (ref 6–24)
Bilirubin Total: 0.4 mg/dL (ref 0.0–1.2)
CALCIUM: 9.8 mg/dL (ref 8.7–10.2)
CO2: 24 mmol/L (ref 20–29)
CREATININE: 0.93 mg/dL (ref 0.57–1.00)
Chloride: 100 mmol/L (ref 96–106)
GFR calc Af Amer: 82 mL/min/{1.73_m2} (ref >59)
GFR, EST NON AFRICAN AMERICAN: 71 mL/min/{1.73_m2} (ref >59)
GLOBULIN, TOTAL: 2.8 g/dL (ref 1.5–4.5)
Glucose: 86 mg/dL (ref 65–99)
Potassium: 4.4 mmol/L (ref 3.5–5.2)
SODIUM: 140 mmol/L (ref 134–144)
Total Protein: 7.4 g/dL (ref 6.0–8.5)

## 2017-06-12 LAB — LIPID PANEL
CHOL/HDL RATIO: 3.5 ratio (ref 0.0–4.4)
Cholesterol, Total: 178 mg/dL (ref 100–199)
HDL: 51 mg/dL (ref >39)
LDL CALC: 111 mg/dL — AB (ref 0–99)
Triglycerides: 80 mg/dL (ref 0–149)
VLDL CHOLESTEROL CAL: 16 mg/dL (ref 5–40)

## 2017-06-12 LAB — CBC

## 2017-06-12 LAB — TESTOSTERONE: TESTOSTERONE: 519 ng/dL — AB (ref 3–41)

## 2017-06-12 LAB — ESTRADIOL: ESTRADIOL: 16.4 pg/mL

## 2017-06-12 NOTE — Progress Notes (Signed)
Subjective:    Patient ID: Danielle Davidson, female    DOB: [birth year], 51 y.o.   MRN: [id number]  HPI Chief Complaint  Patient presents with  . Follow-up    HPI Comments: Danielle Davidson is a 51 y.o. female who presents to Primary Care at [facility] complaining of irritability and fatigue.  Slightly less achy  Patient Active Problem List   Diagnosis Date Noted  . Arthralgia of multiple sites, bilateral 03/23/2017  . Arthralgia of both lower legs 03/23/2017  . Overweight (BMI 25.0-29.9) 03/23/2017  . Anxiety state 03/23/2017  . Attention deficit disorder (ADD) in adult 03/23/2017  . Reactive depression 03/23/2017  . Vitamin D deficiency 02/24/2016  . Surgical menopause 02/24/2016   Past Medical History:  Diagnosis Date  . Allergy   . Arthritis    Past Surgical History:  Procedure Laterality Date  . APPENDECTOMY  08/17/2015   Procedure: ROBOTIC ASSISTED  APPENDECTOMY;  Surgeon: Bobbye Charleston, MD;  Location: [facility];  Service: Gynecology;;  . Consuela Mimes N/A 08/17/2015   Procedure: CYSTOSCOPY;  Surgeon: Bobbye Charleston, MD;  Location: [facility];  Service: Gynecology;  Laterality: N/A;  . egg donation    . ROBOTIC ASSISTED TOTAL HYSTERECTOMY WITH BILATERAL SALPINGO OOPHERECTOMY Bilateral 08/17/2015   Procedure: ROBOTIC ASSISTED TOTAL HYSTERECTOMY WITH BILATERAL SALPINGO OOPHORECTOMY;  Surgeon: Bobbye Charleston, MD;  Location: [facility];  Service: Gynecology;  Laterality: Bilateral;  . rotate cuff Left   . WISDOM TOOTH EXTRACTION     Allergies  Allergen Reactions  . Codeine Nausea Only   Prior to Admission medications   Medication Sig Start Date End Date Taking? Authorizing Provider  ALPRAZolam (XANAX) 0.25 MG tablet Take 1 tablet (0.25 mg total) by mouth 2 (two) times daily as needed. 03/21/17  Yes Shawnee Knapp, MD  Cholecalciferol (VITAMIN D PO) Take by mouth.   Yes [provider]  lisdexamfetamine (VYVANSE) 40 MG capsule Take 1 capsule (40 mg total) by mouth every  morning. 06/09/17  Yes Shawnee Knapp, MD  Testosterone 2 MG/24HR PT24 Place 2 mg onto the skin daily. 05/18/17  Yes Shawnee Knapp, MD  vitamin B-12 (CYANOCOBALAMIN) 100 MCG tablet Take 100 mcg by mouth daily.   Yes [provider]  amphetamine-dextroamphetamine (ADDERALL XR) 10 MG 24 hr capsule Take 1 capsule (10 mg total) by mouth daily. Patient not taking: Reported on 06/12/2017 05/11/17   Shawnee Knapp, MD   Social History   Social History  . Marital status: Married    Spouse name: N/A  . Number of children: N/A  . Years of education: N/A   Occupational History  . Not on file.   Social History Main Topics  . Smoking status: Former Research scientist (life sciences)  . Smokeless tobacco: Never Used  . Alcohol use 0.0 oz/week  . Drug use: No  . Sexual activity: Yes   Other Topics Concern  . Not on file   Social History Narrative  . No narrative on file   Review of Systems Objective:  Physical Exam  Vitals:   06/12/17 1712  BP: 117/79  Pulse: 84  Resp: 17  Temp: 98 F (36.7 C)  TempSrc: Oral  SpO2: 98%  Weight: 167 lb (75.8 kg)  Height: 5' 5.5" (1.664 m)  Body mass index is 27.37 kg/m. Assessment & Plan:   1. Fatigue, unspecified type   2. Adjustment disorder with mixed anxiety and depressed mood - might restart zoloft  3. Endocrine disorder   4.  Adult ADD   Check thyroid panel.  I personally performed the services described in this documentation, which was scribed in my presence. The recorded information has been reviewed and considered, and addended by me as needed.   Delman Cheadle, M.D.  Primary Care at [facility] [address] [place], [place] [address] [phone] phone [phone] fax  06/14/17 11:52 PM

## 2017-06-12 NOTE — Patient Instructions (Signed)
     IF you received an x-ray today, you will receive an invoice from [facility]. Please contact [facility] at [phone] with questions or concerns regarding your invoice.   IF you received labwork today, you will receive an invoice from [facility]. Please contact [facility] at [phone] with questions or concerns regarding your invoice.   Our billing staff will not be able to assist you with questions regarding bills from these companies.  You will be contacted with the lab results as soon as they are available. The fastest way to get your results is to activate your My Chart account. Instructions are located on the last page of this paperwork. If you have not heard from us regarding the results in 2 weeks, please contact this office.     

## 2017-06-13 LAB — THYROID PANEL WITH TSH
FREE THYROXINE INDEX: 2.3 (ref 1.2–4.9)
T3 Uptake Ratio: 27 % (ref 24–39)
T4 TOTAL: 8.6 ug/dL (ref 4.5–12.0)
TSH: 1.99 u[IU]/mL (ref 0.450–4.500)

## 2017-06-14 MED ORDER — SERTRALINE HCL 25 MG PO TABS: 25.0000 mg | ORAL_TABLET | Freq: Every day | ORAL | 0 refills | Status: AC

## 2017-06-21 DIAGNOSIS — E349 Endocrine disorder, unspecified: Secondary | ICD-10-CM | POA: Insufficient documentation

## 2017-06-21 DIAGNOSIS — F4323 Adjustment disorder with mixed anxiety and depressed mood: Secondary | ICD-10-CM | POA: Insufficient documentation

## 2017-06-21 NOTE — Progress Notes (Signed)
Subjective:    Patient ID: Danielle Davidson, female    DOB: 1966/02/02, 51 y.o.   MRN: [id number]  Chief Complaint  Patient presents with  . Rash    possible reaction to the testosterone patchs. Welts that havent gone away x5days.    HPI  Danielle Davidson is a delightful 51 yo woman who is is here to f/u on recent medication adjustments.    ADD: On Vyvanse 40mg  for past 2 weeks and is working much better for her.  Has failed adderall XR 10mg , concerta 18mg , ritalin 10mg ,   Adjustment reaction with mixed anxiety and depressed mood:  Has used alprazolam 0.25 very sparingly ever since she quit smoking several years ago. Was on zoloft after hysterectomy but went off this spring as was worried about sertraline causing weight gain and mood sxs not sig improved.  However, considered restarting sertraline 25 last wk due to current stress with [place] move, job change, house [place] market, wife [name] having mixed feelings about the move.  Endocrine d/o/hormone imbalance: Started testosterone 2mg /24hr patch 1 mo ago. Initially was put on estradiol immed after hysterectomy 02/2016 but felt worse so quickly stopped in <1 mo. Then worsening weight gain, depression, irritability, and arthralgias over the past 15 mos which by her research she thinks are sxs of low testosterone so really wanted to see if sxs would respond to testosterone supplement. Has had life long gender dysphoria but not sure she wants to fully transition to female - just taking it several months at a time to see how she feels as her hormone profile changes.  She is fine with the potential permanent changes of hair pattern, voice deepening, change in body habitus and is otherwise quite low risk.  Started seeing therapist Carol Ada at [facility] about 4-5 mos ago who specializes in supporting people through gender transitions though pt did discuss desire to try testosterone supplement with me prior to starting therapy. Had labs done initially about 3  wks after started testosterone.  Past Medical History:  Diagnosis Date  . Allergy   . Arthritis    Past Surgical History:  Procedure Laterality Date  . APPENDECTOMY  08/17/2015   Procedure: ROBOTIC ASSISTED  APPENDECTOMY;  Surgeon: Bobbye Charleston, MD;  Location: [facility];  Service: Gynecology;;  . Consuela Mimes N/A 08/17/2015   Procedure: CYSTOSCOPY;  Surgeon: Bobbye Charleston, MD;  Location: [facility];  Service: Gynecology;  Laterality: N/A;  . egg donation    . ROBOTIC ASSISTED TOTAL HYSTERECTOMY WITH BILATERAL SALPINGO OOPHERECTOMY Bilateral 08/17/2015   Procedure: ROBOTIC ASSISTED TOTAL HYSTERECTOMY WITH BILATERAL SALPINGO OOPHORECTOMY;  Surgeon: Bobbye Charleston, MD;  Location: [facility];  Service: Gynecology;  Laterality: Bilateral;  . rotate cuff Left   . WISDOM TOOTH EXTRACTION     Current Outpatient Prescriptions on File Prior to Visit  Medication Sig Dispense Refill  . Cholecalciferol (VITAMIN D PO) Take by mouth.    . sertraline (ZOLOFT) 25 MG tablet Take 1 tablet (25 mg total) by mouth daily. 90 tablet 0  . vitamin B-12 (CYANOCOBALAMIN) 100 MCG tablet Take 100 mcg by mouth daily.     No current facility-administered medications on file prior to visit.    Allergies  Allergen Reactions  . Codeine Nausea Only   Family History  Problem Relation Age of Onset  . Arthritis Father   . Cancer Maternal Grandmother   . Stroke Paternal Grandfather   . Breast cancer Cousin    Social History   Social History  .  Marital status: Married    Spouse name: N/A  . Number of children: N/A  . Years of education: N/A   Social History Main Topics  . Smoking status: Former Research scientist (life sciences)  . Smokeless tobacco: Never Used  . Alcohol use 0.0 oz/week  . Drug use: No  . Sexual activity: Yes   Other Topics Concern  . None   Social History Narrative  . None   Depression screen [facility] 2/9 06/24/2017 06/12/2017 05/11/2017 04/19/2017 03/21/2017  Decreased Interest 0 0 1 0 0  Down, Depressed, Hopeless 0 0 1 0 0   PHQ - 2 Score 0 0 2 0 0  Altered sleeping - 0 0 - -  Tired, decreased energy - 0 3 - -  Change in appetite - 0 0 - -  Feeling bad or failure about yourself  - 0 0 - -  Trouble concentrating - 0 3 - -  Moving slowly or fidgety/restless - 0 0 - -  Suicidal thoughts - 0 0 - -  PHQ-9 Score - 0 8 - -  Difficult doing work/chores - Not difficult at all Somewhat difficult - -     Review of Systems See hpi    Objective:   Physical Exam  Constitutional: She is oriented to person, place, and time. She appears well-developed and well-nourished. No distress.  HENT:  Head: Normocephalic and atraumatic.  Right Ear: External ear normal.  Left Ear: External ear normal.  Eyes: Conjunctivae are normal. No scleral icterus.  Neck: Normal range of motion. Neck supple. No thyromegaly present.  Cardiovascular: Normal rate, regular rhythm, normal heart sounds and intact distal pulses.   Pulmonary/Chest: Effort normal and breath sounds normal. No respiratory distress.  Musculoskeletal: She exhibits no edema.  Lymphadenopathy:    She has no cervical adenopathy.  Neurological: She is alert and oriented to person, place, and time.  Skin: Skin is warm and dry. She is not diaphoretic. No erythema.  Psychiatric: She has a normal mood and affect. Her behavior is normal.      BP 114/77   Pulse (!) 58   Temp 97.7 F (36.5 C) (Oral)   Resp 18   Ht 5' 5.5" (1.664 m)   Wt 167 lb 6.4 oz (75.9 kg)   LMP 08/13/2012   SpO2 97%   BMI 27.43 kg/m      Assessment & Plan:  5 wks since starting testosterone? Check labs today or right before she leaves at lab only visit? CBC, metabolic panel, lipid profile, testosterone level (goal range [phone] ng/dL), estradiol level  1. Attention deficit disorder (ADD) in adult   2. Endocrine disorder   3. Hormone imbalance   4. Medication monitoring encounter   5. Adjustment disorder with mixed anxiety and depressed mood   6. Overweight (BMI 25.0-29.9)   7.  Vitamin D deficiency     Orders Placed This Encounter  Procedures  . CBC with Differential/Platelet  . Comprehensive metabolic panel    Order Specific Question:   Has the patient fasted?    Answer:   Yes  . Lipid panel    Order Specific Question:   Has the patient fasted?    Answer:   Yes  . TestT+TestF+SHBG  . Estradiol    Meds ordered this encounter  Medications  . DISCONTD: lisdexamfetamine (VYVANSE) 40 MG capsule    Sig: Take 1 capsule (40 mg total) by mouth every morning.    Dispense:  30 capsule    Refill:  0  .  DISCONTD: lisdexamfetamine (VYVANSE) 40 MG capsule    Sig: Take 1 capsule (40 mg total) by mouth every morning.    Dispense:  30 capsule    Refill:  0    For 30 days after date written  . lisdexamfetamine (VYVANSE) 40 MG capsule    Sig: Take 1 capsule (40 mg total) by mouth every morning.    Dispense:  30 capsule    Refill:  0    For 60 days after date written  . DISCONTD: Testosterone 10 MG/ACT (2%) GEL    Sig: Place 2 Act onto the skin every morning. Onto front or inside of the thighs    Dispense:  60 g    Refill:  0  . triamcinolone cream (KENALOG) 0.1 %    Sig: Apply 1 application topically 4 (four) times daily. Prn drug rash    Dispense:  30 g    Refill:  0  . ALPRAZolam (XANAX) 0.25 MG tablet    Sig: Take 1 tablet (0.25 mg total) by mouth 2 (two) times daily as needed.    Dispense:  60 tablet    Refill:  1  . Testosterone 10 MG/ACT (2%) GEL    Sig: Place 4 Act onto the skin every morning. Onto front or inside of the thighs    Dispense:  60 g    Refill:  2     Delman Cheadle, M.D.  Primary Care at [facility] [address] [place], [place] [address] [phone] phone [phone] fax  06/26/17 11:54 PM   "It is common to use 50 to 100 mg of testosterone enanthate or testosterone cypionate weekly or 200 mg every two weeks. Route of administration can be intramuscular or subcutaneous with similar effects. Although the subcutaneous  route is not approved in the [place] by the [place] [employer] (FDA), it appears to be effective and well tolerated. Testosterone gel (1% or 1.6%, 2.5 to 10 g/day) may also be used [4], but virilization might be slower if lower serum testosterone concentrations are achieved than with testosterone injection. Some clinicians switch to gels once initial virilization is complete. endocrine monitoring should include serum testosterone, with goals of maintaining serum concentrations approximately 400 to 800 ng/dL (13.9 to 27.7 nmol/L). For patients on testosterone injections, trough levels should be towards the lower end of this range, while peak levels should not exceed 1000 ng/dL (34.7 nmol/L). Individuals taking testosterone gels should have similar targets, but the serum testosterone levels achieved tend to be at the lower end of the normal range. Serum estradiol is monitored during the first six months of testosterone treatment or until there has been no uterine bleeding for six months. Estradiol levels should be <50 pg/mL (184 pmol/L) (table 3).

## 2017-06-24 ENCOUNTER — Encounter: Payer: Self-pay | Admitting: Family Medicine

## 2017-06-24 ENCOUNTER — Ambulatory Visit (INDEPENDENT_AMBULATORY_CARE_PROVIDER_SITE_OTHER): Payer: 59 | Admitting: Family Medicine

## 2017-06-24 VITALS — BP 114/77 | HR 58 | Temp 97.7°F | Resp 18 | Ht 65.5 in | Wt 167.4 lb

## 2017-06-24 DIAGNOSIS — E559 Vitamin D deficiency, unspecified: Secondary | ICD-10-CM | POA: Diagnosis not present

## 2017-06-24 DIAGNOSIS — Z5181 Encounter for therapeutic drug level monitoring: Secondary | ICD-10-CM

## 2017-06-24 DIAGNOSIS — F988 Other specified behavioral and emotional disorders with onset usually occurring in childhood and adolescence: Secondary | ICD-10-CM

## 2017-06-24 DIAGNOSIS — F4323 Adjustment disorder with mixed anxiety and depressed mood: Secondary | ICD-10-CM | POA: Diagnosis not present

## 2017-06-24 DIAGNOSIS — E349 Endocrine disorder, unspecified: Secondary | ICD-10-CM

## 2017-06-24 DIAGNOSIS — E663 Overweight: Secondary | ICD-10-CM | POA: Diagnosis not present

## 2017-06-24 MED ORDER — LISDEXAMFETAMINE DIMESYLATE 40 MG PO CAPS: 40.0000 mg | ORAL_CAPSULE | ORAL | 0 refills | Status: DC

## 2017-06-24 MED ORDER — ALPRAZOLAM 0.25 MG PO TABS: 0.2500 mg | ORAL_TABLET | Freq: Two times a day (BID) | ORAL | 1 refills | Status: AC | PRN

## 2017-06-24 MED ORDER — TESTOSTERONE 10 MG/ACT (2%) TD GEL: 2.0000 | TRANSDERMAL | 0 refills | Status: DC

## 2017-06-24 MED ORDER — LISDEXAMFETAMINE DIMESYLATE 40 MG PO CAPS: 40.0000 mg | ORAL_CAPSULE | ORAL | 0 refills | Status: AC

## 2017-06-24 MED ORDER — TRIAMCINOLONE ACETONIDE 0.1 % EX CREA: 1.0000 "application " | TOPICAL_CREAM | Freq: Four times a day (QID) | CUTANEOUS | 0 refills | Status: AC

## 2017-06-24 MED ORDER — TESTOSTERONE 10 MG/ACT (2%) TD GEL: 4.0000 | TRANSDERMAL | 2 refills | Status: DC

## 2017-06-24 NOTE — Patient Instructions (Signed)
     IF you received an x-ray today, you will receive an invoice from [facility]. Please contact [facility] at [phone] with questions or concerns regarding your invoice.   IF you received labwork today, you will receive an invoice from [facility]. Please contact [facility] at [phone] with questions or concerns regarding your invoice.   Our billing staff will not be able to assist you with questions regarding bills from these companies.  You will be contacted with the lab results as soon as they are available. The fastest way to get your results is to activate your My Chart account. Instructions are located on the last page of this paperwork. If you have not heard from us regarding the results in 2 weeks, please contact this office.     

## 2017-06-26 LAB — COMPREHENSIVE METABOLIC PANEL
ALT: 16 IU/L (ref 0–32)
AST: 16 IU/L (ref 0–40)
Albumin/Globulin Ratio: 1.8 (ref 1.2–2.2)
Albumin: 4.4 g/dL (ref 3.5–5.5)
Alkaline Phosphatase: 121 IU/L — ABNORMAL HIGH (ref 39–117)
BUN/Creatinine Ratio: 17 (ref 9–23)
BUN: 14 mg/dL (ref 6–24)
Bilirubin Total: 0.2 mg/dL (ref 0.0–1.2)
CALCIUM: 9.8 mg/dL (ref 8.7–10.2)
CO2: 24 mmol/L (ref 20–29)
Chloride: 105 mmol/L (ref 96–106)
Creatinine, Ser: 0.83 mg/dL (ref 0.57–1.00)
GFR, EST AFRICAN AMERICAN: 94 mL/min/{1.73_m2} (ref >59)
GFR, EST NON AFRICAN AMERICAN: 82 mL/min/{1.73_m2} (ref >59)
GLUCOSE: 102 mg/dL — AB (ref 65–99)
Globulin, Total: 2.5 g/dL (ref 1.5–4.5)
Potassium: 4.2 mmol/L (ref 3.5–5.2)
Sodium: 142 mmol/L (ref 134–144)
TOTAL PROTEIN: 6.9 g/dL (ref 6.0–8.5)

## 2017-06-26 LAB — CBC WITH DIFFERENTIAL/PLATELET
BASOS ABS: 0 10*3/uL (ref 0.0–0.2)
BASOS: 1 %
EOS (ABSOLUTE): 0.1 10*3/uL (ref 0.0–0.4)
Eos: 3 %
Hematocrit: 42.6 % (ref 34.0–46.6)
Hemoglobin: 13.9 g/dL (ref 11.1–15.9)
IMMATURE GRANS (ABS): 0 10*3/uL (ref 0.0–0.1)
IMMATURE GRANULOCYTES: 0 %
LYMPHS: 38 %
Lymphocytes Absolute: 1.6 10*3/uL (ref 0.7–3.1)
MCH: 27.6 pg (ref 26.6–33.0)
MCHC: 32.6 g/dL (ref 31.5–35.7)
MCV: 85 fL (ref 79–97)
Monocytes Absolute: 0.3 10*3/uL (ref 0.1–0.9)
Monocytes: 6 %
NEUTROS PCT: 52 %
Neutrophils Absolute: 2.2 10*3/uL (ref 1.4–7.0)
PLATELETS: 266 10*3/uL (ref 150–379)
RBC: 5.03 x10E6/uL (ref 3.77–5.28)
RDW: 14.5 % (ref 12.3–15.4)
WBC: 4.2 10*3/uL (ref 3.4–10.8)

## 2017-06-26 LAB — LIPID PANEL
CHOL/HDL RATIO: 3.6 ratio (ref 0.0–4.4)
Cholesterol, Total: 167 mg/dL (ref 100–199)
HDL: 47 mg/dL (ref >39)
LDL Calculated: 107 mg/dL — ABNORMAL HIGH (ref 0–99)
Triglycerides: 64 mg/dL (ref 0–149)
VLDL CHOLESTEROL CAL: 13 mg/dL (ref 5–40)

## 2017-06-26 LAB — TESTT+TESTF+SHBG
SEX HORMONE BINDING: 65.4 nmol/L (ref 17.3–125.0)
TESTOSTERONE, TOTAL: 184.1 ng/dL
Testosterone, Free: 3.6 pg/mL (ref 0.0–4.2)

## 2017-06-26 LAB — ESTRADIOL: ESTRADIOL: 14.4 pg/mL

## 2017-06-27 ENCOUNTER — Telehealth: Payer: Self-pay | Admitting: Family Medicine

## 2017-06-27 ENCOUNTER — Telehealth: Payer: Self-pay

## 2017-06-27 MED ORDER — TESTOSTERONE 50 MG/5GM (1%) TD GEL: 5.0000 g | Freq: Every day | TRANSDERMAL | 5 refills | Status: AC

## 2017-06-27 NOTE — Telephone Encounter (Signed)
If they call back it was already sent. Thanks

## 2017-06-27 NOTE — Telephone Encounter (Signed)
Rx faxed to [facility]. Confirmation fax received.

## 2017-06-27 NOTE — Telephone Encounter (Signed)
Noted./ S.Shaunee Mulkern,CMA

## 2017-06-27 NOTE — Telephone Encounter (Signed)
Printed and will call/fax in

## 2017-06-27 NOTE — Telephone Encounter (Signed)
Already faxed to pharmacy.

## 2017-06-27 NOTE — Telephone Encounter (Signed)
THIS MESSAGE IS FROM [place] DR. SHAW:  SHE PRESCRIBED THE PATIENT TO HAVE FORTESTA. HER INSURANCE WILL NOT COVER THE DRUG. CAN IT BE SUBSTITUTED WITH TESTIM JELL?  NOTE: I WAS NOT ABLE TO PUT THIS MESSAGE IN ON WED. (06/26/17) WHEN THE PHARMACY CALLED BECAUSE EPIC WAS DOWN. PER JILL IT IS OK TO TELL THE PHARMACY TO SUBSTITUTE USING THE SAME DIRECTIONS AS THE FORTESTA. THIS WAS [facility] ON [address]. THE PHARMACIST NAME IS HUI. [phone]. [place]

## 2017-06-27 NOTE — Telephone Encounter (Signed)
[facility] is calling to check on the status of a clarification of pt's Testim cream.  They want to know if pt needs the 100mg  or the 50mg  dosage. Please advise  [phone]

## 2017-06-27 NOTE — Telephone Encounter (Signed)
[facility] CALLED BACK THIS MORNING TO SAY THAT THE DIRECTIONS FOR THE TESTIM 1% JELL IS NOT THE SAME DIRECTIONS AS THE FORTESTA. PLEASE CALL BACK WITH HOW THE PATIENT SHOULD USE THIS PLEASE.

## 2017-07-01 NOTE — Telephone Encounter (Signed)
Call to pharmacy to follow up on PA.  They said that it had been approved and patient was aware.

## 2017-07-05 ENCOUNTER — Telehealth: Payer: Self-pay | Admitting: Family Medicine

## 2017-07-05 NOTE — Telephone Encounter (Signed)
Received another PA on the testosterone today, but I checked on cover my meds, and it has been approved.  I will notify the pharmacy again to see what the hold up is.

## 2017-07-05 NOTE — Telephone Encounter (Signed)
[facility] in [place] is calling to ask if Danielle Davidson could change Septembers Vyvanse prescription to August.  The pt cannot locate her August script.  Please advise pharmacy.  [phone]

## 2017-07-06 NOTE — Telephone Encounter (Signed)
Called pharmacy and advised it was fine to fill September's rx this month.

## 2017-07-06 NOTE — Telephone Encounter (Signed)
Please advise 

## 2017-07-06 NOTE — Telephone Encounter (Signed)
Called to check status... Of req. For refill req. Pt is completely out  Pharm: [facility], [place]. [place] [address] [phone]  [phone]

## 2017-09-21 IMAGING — MG MM DIGITAL SCREENING BILAT
4 series · 4 of 4 positions shown · non-contrast
Comparison: Previous exam(s).

CLINICAL DATA: Screening.

EXAM:
DIGITAL SCREENING BILATERAL MAMMOGRAM WITH CAD

[L CC]
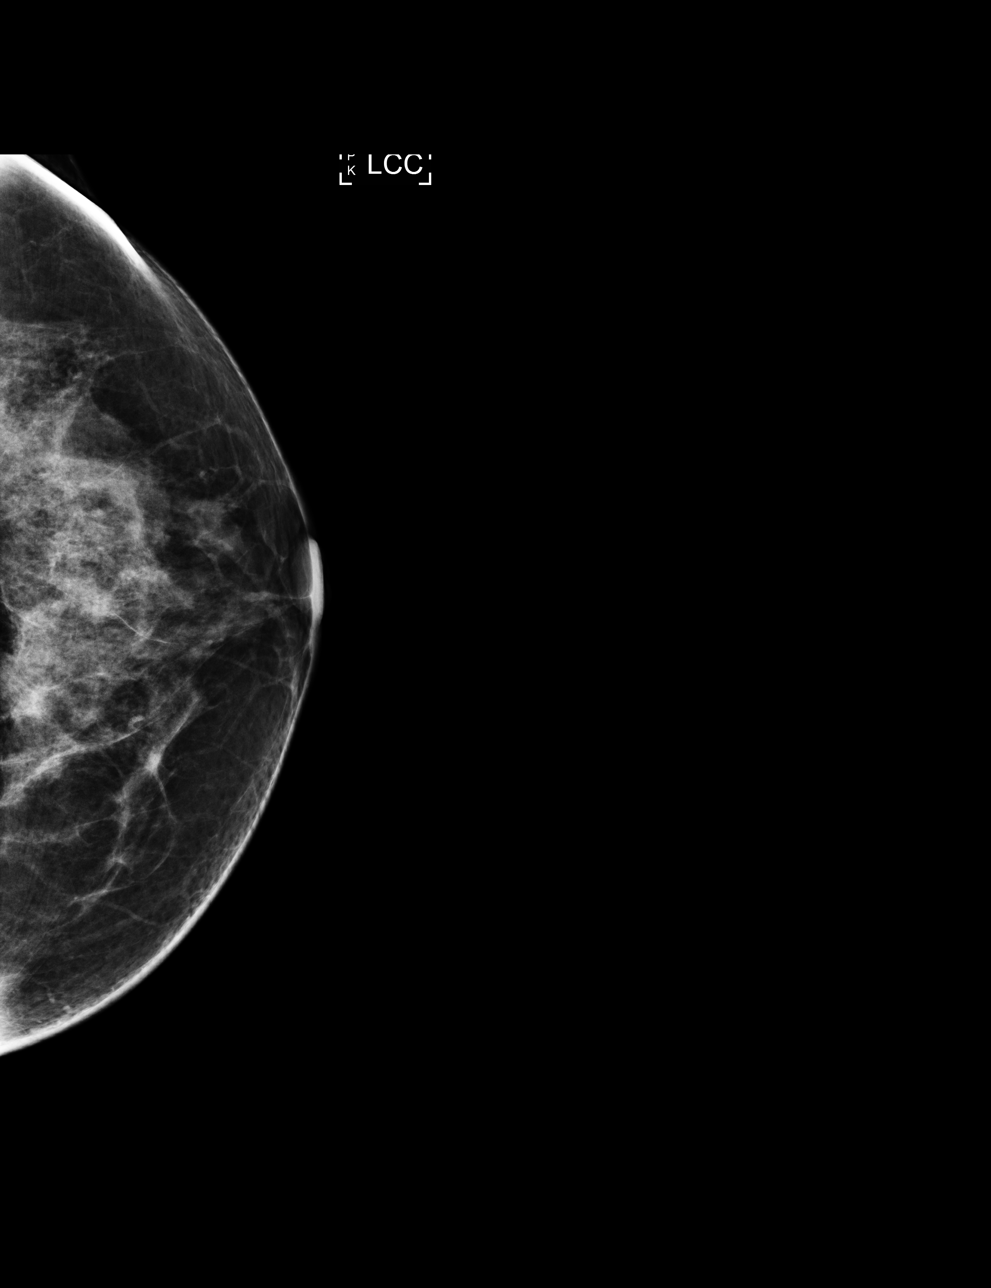

[R MLO]
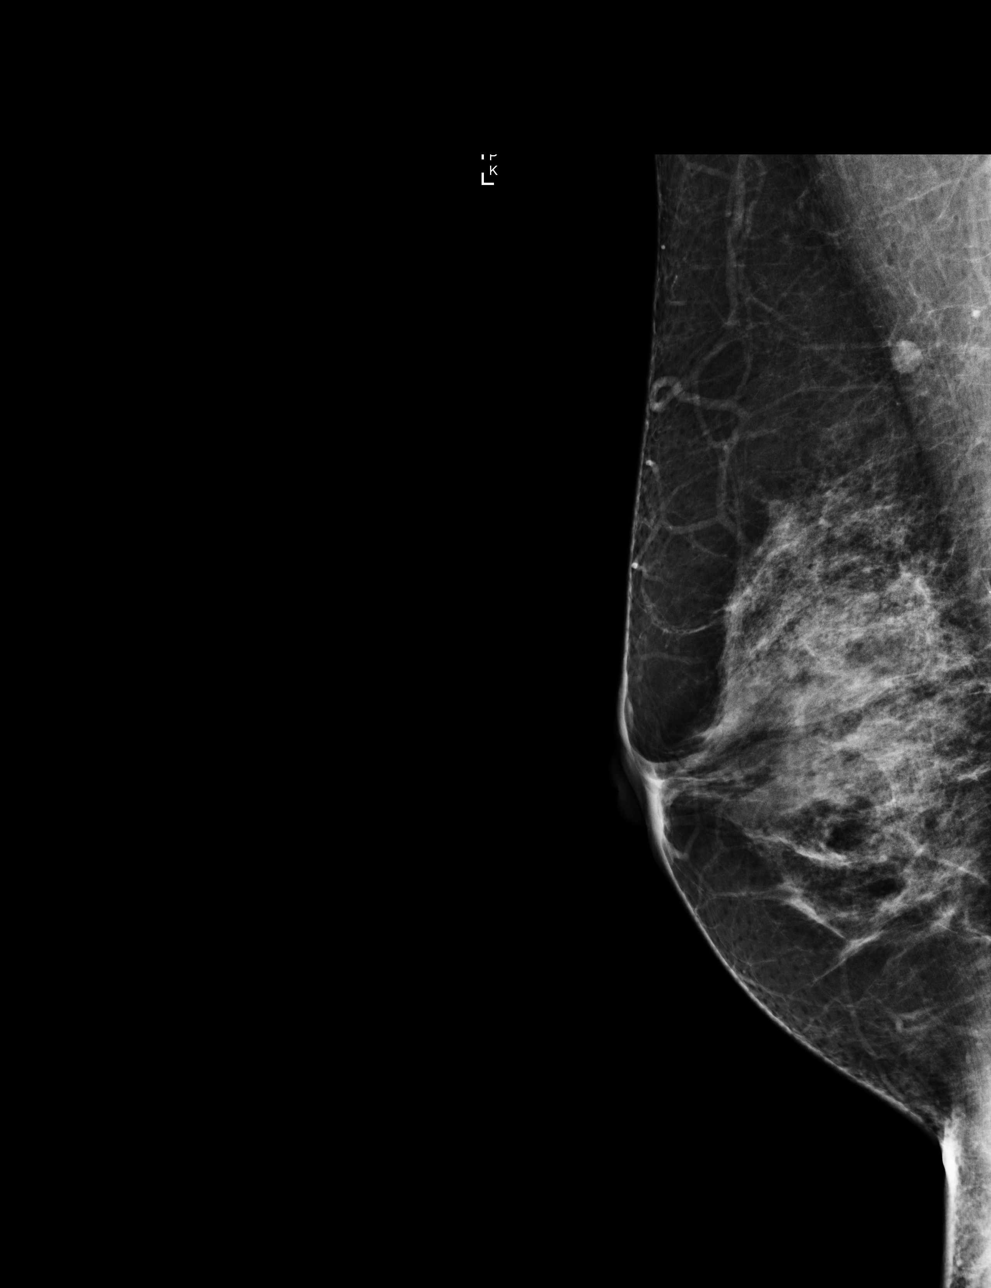

[L MLO]
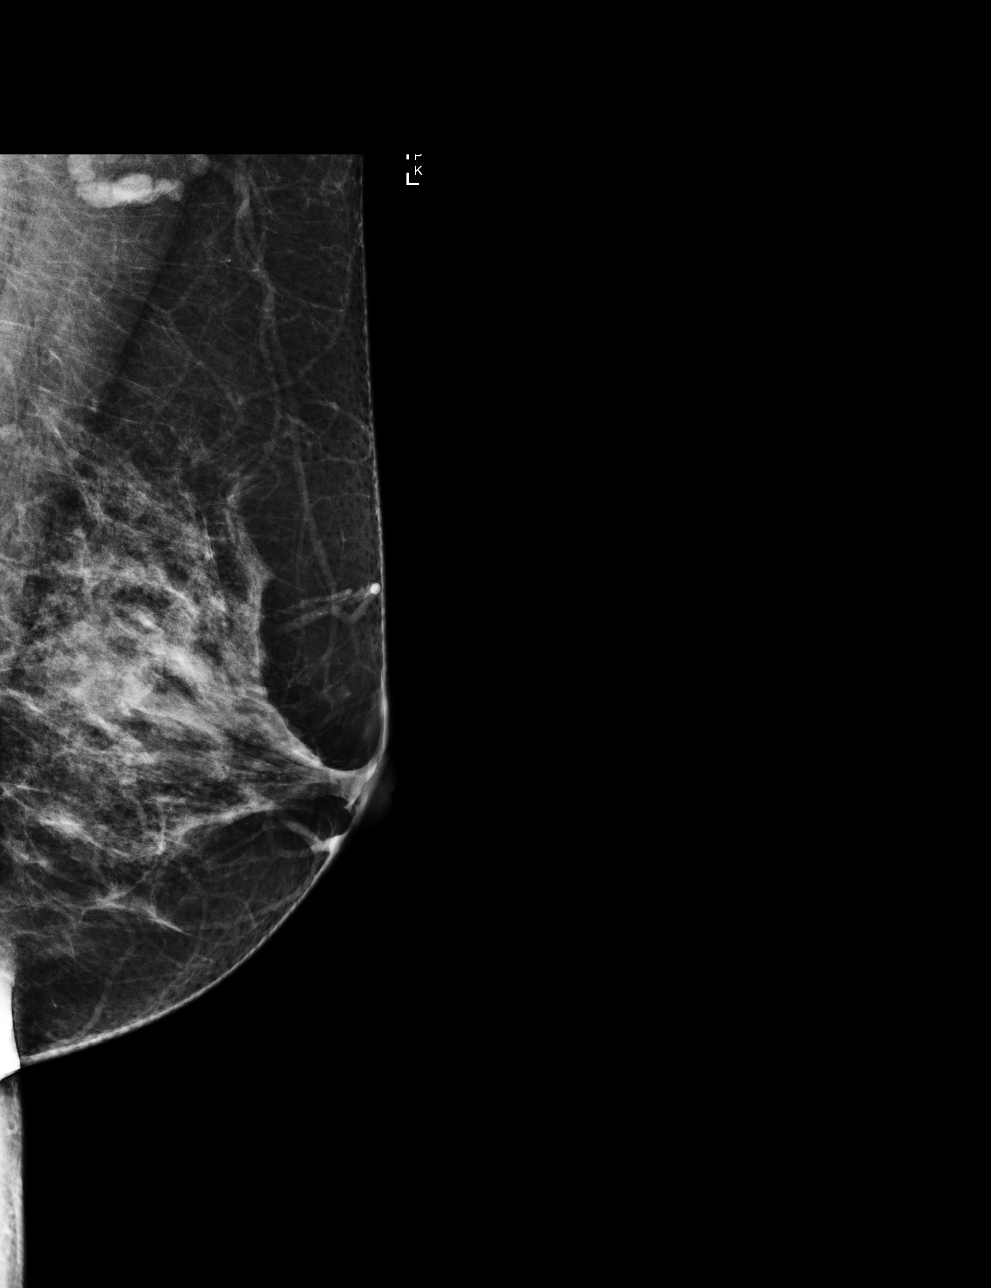

[R CC]
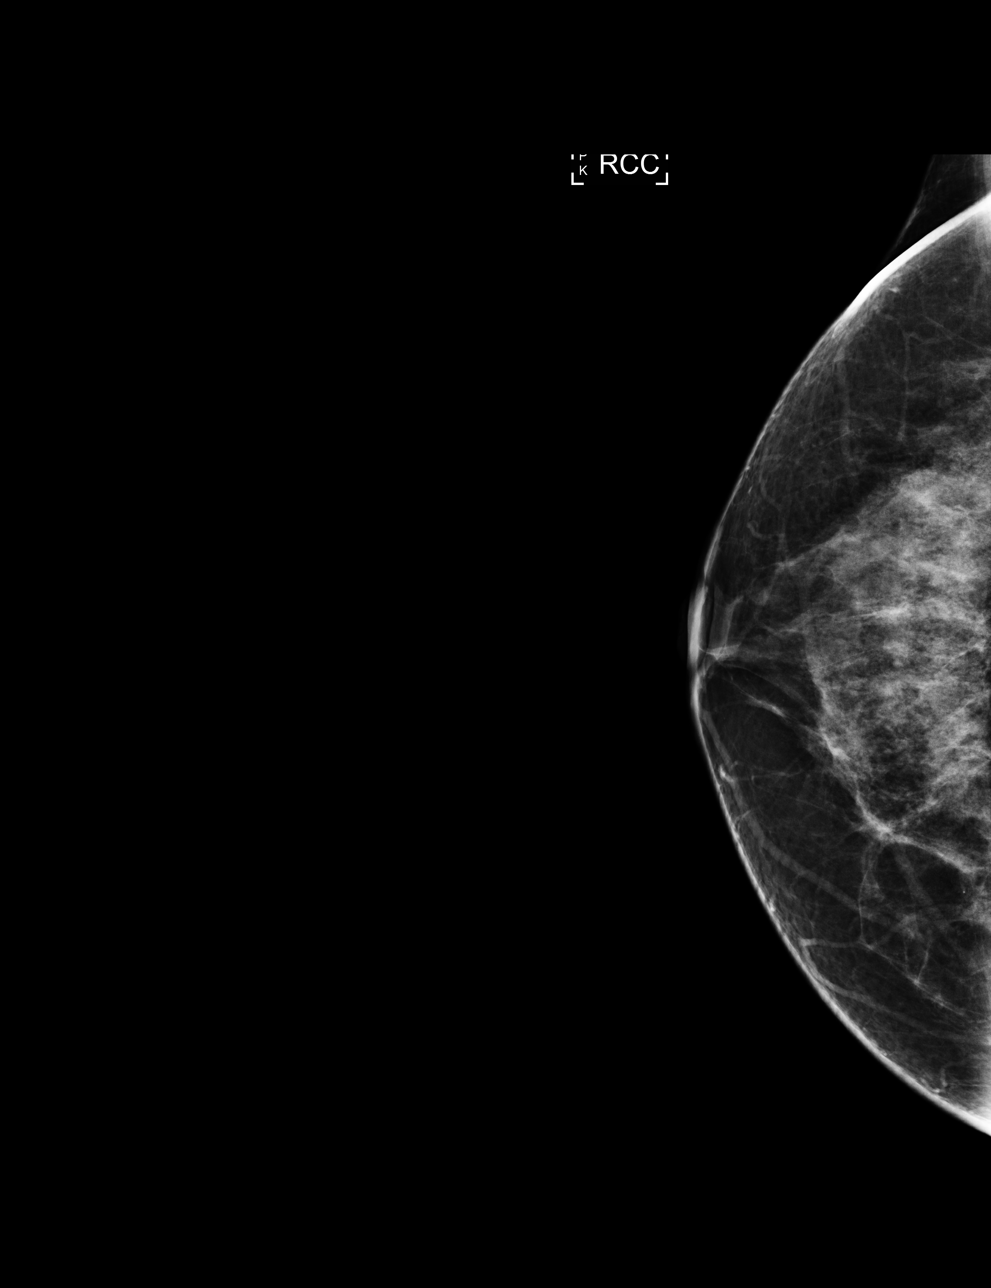

[4 of 4 positions shown; findings below may reference images not displayed]

ACR Breast Density Category c: The breast tissue is heterogeneously
dense, which may obscure small masses.
FINDINGS: There are no findings suspicious for malignancy. Images were
processed with CAD.
IMPRESSION: No mammographic evidence of malignancy. A result letter of this
screening mammogram will be mailed directly to the patient.

RECOMMENDATION:
Screening mammogram in one year. (Code:YJ-[id number])

BI-RADS CATEGORY  1: Negative.
